# Patient Record
Sex: Female | Born: 1955 | Race: White | Hispanic: No | Smoking: Never smoker
Health system: Southern US, Community
[De-identification: ages and names within clinical notes are randomized; demographics above are authoritative.]

## PROBLEM LIST (undated history)

## (undated) DIAGNOSIS — I701 Atherosclerosis of renal artery: Secondary | ICD-10-CM

## (undated) DIAGNOSIS — J45909 Unspecified asthma, uncomplicated: Secondary | ICD-10-CM

## (undated) DIAGNOSIS — B029 Zoster without complications: Secondary | ICD-10-CM

## (undated) DIAGNOSIS — M810 Age-related osteoporosis without current pathological fracture: Secondary | ICD-10-CM

## (undated) DIAGNOSIS — I2510 Atherosclerotic heart disease of native coronary artery without angina pectoris: Secondary | ICD-10-CM

## (undated) DIAGNOSIS — I219 Acute myocardial infarction, unspecified: Secondary | ICD-10-CM

## (undated) DIAGNOSIS — I639 Cerebral infarction, unspecified: Secondary | ICD-10-CM

## (undated) DIAGNOSIS — N189 Chronic kidney disease, unspecified: Secondary | ICD-10-CM

## (undated) DIAGNOSIS — I10 Essential (primary) hypertension: Secondary | ICD-10-CM

## (undated) DIAGNOSIS — J392 Other diseases of pharynx: Secondary | ICD-10-CM

## (undated) HISTORY — DX: Chronic kidney disease, unspecified: N18.9

## (undated) HISTORY — DX: Zoster without complications: B02.9

## (undated) HISTORY — DX: Atherosclerosis of renal artery: I70.1

## (undated) HISTORY — DX: Other diseases of pharynx: J39.2

## (undated) HISTORY — DX: Atherosclerotic heart disease of native coronary artery without angina pectoris: I25.10

## (undated) HISTORY — DX: Acute myocardial infarction, unspecified: I21.9

## (undated) HISTORY — DX: Age-related osteoporosis without current pathological fracture: M81.0

## (undated) HISTORY — DX: Essential (primary) hypertension: I10

## (undated) HISTORY — DX: Unspecified asthma, uncomplicated: J45.909

## (undated) HISTORY — PX: CORONARY ANGIOPLASTY WITH STENT PLACEMENT: SHX49

## (undated) HISTORY — PX: TUBAL LIGATION: SHX77

## (undated) HISTORY — DX: Cerebral infarction, unspecified: I63.9

---

## 2002-03-27 ENCOUNTER — Inpatient Hospital Stay (HOSPITAL_COMMUNITY): Admission: EM | Admit: 2002-03-27 | Discharge: 2002-03-30 | Payer: Self-pay | Admitting: Cardiology

## 2002-03-27 ENCOUNTER — Encounter: Payer: Self-pay | Admitting: Emergency Medicine

## 2003-04-27 ENCOUNTER — Emergency Department (HOSPITAL_COMMUNITY): Admission: EM | Admit: 2003-04-27 | Discharge: 2003-04-27 | Payer: Self-pay | Admitting: Emergency Medicine

## 2003-04-28 ENCOUNTER — Emergency Department (HOSPITAL_COMMUNITY): Admission: EM | Admit: 2003-04-28 | Discharge: 2003-04-28 | Payer: Self-pay | Admitting: Emergency Medicine

## 2003-05-25 ENCOUNTER — Emergency Department (HOSPITAL_COMMUNITY): Admission: EM | Admit: 2003-05-25 | Discharge: 2003-05-25 | Payer: Self-pay | Admitting: Emergency Medicine

## 2003-07-06 ENCOUNTER — Ambulatory Visit (HOSPITAL_COMMUNITY): Admission: RE | Admit: 2003-07-06 | Discharge: 2003-07-06 | Payer: Self-pay | Admitting: Neurology

## 2003-09-05 ENCOUNTER — Emergency Department (HOSPITAL_COMMUNITY): Admission: EM | Admit: 2003-09-05 | Discharge: 2003-09-05 | Payer: Self-pay | Admitting: Emergency Medicine

## 2003-09-07 ENCOUNTER — Ambulatory Visit (HOSPITAL_COMMUNITY): Admission: RE | Admit: 2003-09-07 | Discharge: 2003-09-07 | Payer: Self-pay | Admitting: Family Medicine

## 2004-08-29 ENCOUNTER — Emergency Department (HOSPITAL_COMMUNITY): Admission: EM | Admit: 2004-08-29 | Discharge: 2004-08-29 | Payer: Self-pay | Admitting: Emergency Medicine

## 2004-08-31 ENCOUNTER — Inpatient Hospital Stay (HOSPITAL_COMMUNITY): Admission: EM | Admit: 2004-08-31 | Discharge: 2004-09-02 | Payer: Self-pay | Admitting: Emergency Medicine

## 2005-04-01 ENCOUNTER — Inpatient Hospital Stay (HOSPITAL_COMMUNITY): Admission: AD | Admit: 2005-04-01 | Discharge: 2005-04-06 | Payer: Self-pay | Admitting: *Deleted

## 2005-04-01 ENCOUNTER — Ambulatory Visit: Payer: Self-pay | Admitting: Physical Medicine & Rehabilitation

## 2005-04-01 ENCOUNTER — Encounter: Payer: Self-pay | Admitting: Emergency Medicine

## 2005-04-02 ENCOUNTER — Encounter (INDEPENDENT_AMBULATORY_CARE_PROVIDER_SITE_OTHER): Payer: Self-pay | Admitting: *Deleted

## 2005-04-14 ENCOUNTER — Ambulatory Visit: Payer: Self-pay | Admitting: Family Medicine

## 2005-05-06 ENCOUNTER — Ambulatory Visit (HOSPITAL_COMMUNITY): Admission: RE | Admit: 2005-05-06 | Discharge: 2005-05-06 | Payer: Self-pay | Admitting: Family Medicine

## 2005-05-06 ENCOUNTER — Encounter (INDEPENDENT_AMBULATORY_CARE_PROVIDER_SITE_OTHER): Payer: Self-pay | Admitting: Internal Medicine

## 2005-05-25 ENCOUNTER — Ambulatory Visit: Payer: Self-pay | Admitting: Family Medicine

## 2006-01-12 ENCOUNTER — Ambulatory Visit: Payer: Self-pay | Admitting: Internal Medicine

## 2006-01-26 ENCOUNTER — Ambulatory Visit (HOSPITAL_COMMUNITY): Admission: RE | Admit: 2006-01-26 | Discharge: 2006-01-26 | Payer: Self-pay | Admitting: Internal Medicine

## 2006-01-29 ENCOUNTER — Ambulatory Visit: Payer: Self-pay | Admitting: Internal Medicine

## 2006-02-03 ENCOUNTER — Ambulatory Visit (HOSPITAL_COMMUNITY): Admission: RE | Admit: 2006-02-03 | Discharge: 2006-02-03 | Payer: Self-pay | Admitting: Internal Medicine

## 2006-02-10 ENCOUNTER — Ambulatory Visit: Payer: Self-pay | Admitting: Internal Medicine

## 2006-02-11 ENCOUNTER — Ambulatory Visit (HOSPITAL_COMMUNITY): Admission: RE | Admit: 2006-02-11 | Discharge: 2006-02-11 | Payer: Self-pay | Admitting: Internal Medicine

## 2006-02-16 ENCOUNTER — Encounter (INDEPENDENT_AMBULATORY_CARE_PROVIDER_SITE_OTHER): Payer: Self-pay | Admitting: Internal Medicine

## 2006-02-27 ENCOUNTER — Encounter (INDEPENDENT_AMBULATORY_CARE_PROVIDER_SITE_OTHER): Payer: Self-pay | Admitting: Internal Medicine

## 2006-03-30 ENCOUNTER — Other Ambulatory Visit: Admission: RE | Admit: 2006-03-30 | Discharge: 2006-03-30 | Payer: Self-pay | Admitting: Internal Medicine

## 2006-03-30 ENCOUNTER — Ambulatory Visit: Payer: Self-pay | Admitting: Internal Medicine

## 2006-03-30 LAB — CONVERTED CEMR LAB
Cholesterol: 238 mg/dL
LDL Cholesterol: 146 mg/dL
Potassium: 4.1 meq/L
Sodium: 139 meq/L
Triglycerides: 228 mg/dL
VLDL: 46 mg/dL

## 2006-04-06 ENCOUNTER — Ambulatory Visit (HOSPITAL_COMMUNITY): Admission: RE | Admit: 2006-04-06 | Discharge: 2006-04-06 | Payer: Self-pay | Admitting: Internal Medicine

## 2006-04-12 DIAGNOSIS — R32 Unspecified urinary incontinence: Secondary | ICD-10-CM | POA: Insufficient documentation

## 2006-04-12 DIAGNOSIS — N183 Chronic kidney disease, stage 3 (moderate): Secondary | ICD-10-CM

## 2006-04-12 DIAGNOSIS — I6930 Unspecified sequelae of cerebral infarction: Secondary | ICD-10-CM | POA: Insufficient documentation

## 2006-04-13 ENCOUNTER — Encounter (INDEPENDENT_AMBULATORY_CARE_PROVIDER_SITE_OTHER): Payer: Self-pay | Admitting: Internal Medicine

## 2006-05-05 ENCOUNTER — Ambulatory Visit (HOSPITAL_COMMUNITY): Admission: RE | Admit: 2006-05-05 | Discharge: 2006-05-05 | Payer: Self-pay | Admitting: Internal Medicine

## 2006-05-06 ENCOUNTER — Encounter (INDEPENDENT_AMBULATORY_CARE_PROVIDER_SITE_OTHER): Payer: Self-pay | Admitting: Internal Medicine

## 2006-06-02 ENCOUNTER — Ambulatory Visit: Payer: Self-pay | Admitting: Internal Medicine

## 2006-06-30 ENCOUNTER — Ambulatory Visit: Payer: Self-pay | Admitting: Internal Medicine

## 2006-06-30 DIAGNOSIS — E785 Hyperlipidemia, unspecified: Secondary | ICD-10-CM

## 2006-06-30 DIAGNOSIS — K219 Gastro-esophageal reflux disease without esophagitis: Secondary | ICD-10-CM

## 2006-08-10 ENCOUNTER — Encounter (INDEPENDENT_AMBULATORY_CARE_PROVIDER_SITE_OTHER): Payer: Self-pay | Admitting: Internal Medicine

## 2006-08-11 ENCOUNTER — Ambulatory Visit: Payer: Self-pay | Admitting: Internal Medicine

## 2006-08-12 LAB — CONVERTED CEMR LAB
AST: 16 units/L (ref 0–37)
Albumin: 4.4 g/dL (ref 3.5–5.2)
Alkaline Phosphatase: 185 units/L — ABNORMAL HIGH (ref 39–117)
Total Bilirubin: 0.4 mg/dL (ref 0.3–1.2)
Total Protein: 7.9 g/dL (ref 6.0–8.3)

## 2006-09-10 ENCOUNTER — Ambulatory Visit: Payer: Self-pay | Admitting: Internal Medicine

## 2006-09-13 LAB — CONVERTED CEMR LAB
ALT: 17 units/L (ref 0–35)
CO2: 19 meq/L (ref 19–32)
Cholesterol: 169 mg/dL (ref 0–200)
Creatinine, Ser: 1.13 mg/dL (ref 0.40–1.20)
HDL: 43 mg/dL (ref >39)
Total Bilirubin: 0.4 mg/dL (ref 0.3–1.2)
Total CHOL/HDL Ratio: 3.9
VLDL: 32 mg/dL (ref 0–40)

## 2006-11-11 IMAGING — US US EXTREM LOW ARTERIAL SEG MULTIPLE*R*
1 series · 14 of 19 positions shown · non-contrast
Comparison: none

HISTORY: Right groin mass status post angiography, question pseudoaneurysm

[Series 1: unknown · 0.09mm/px · 14 of 19 slices shown]
[im 1/19]
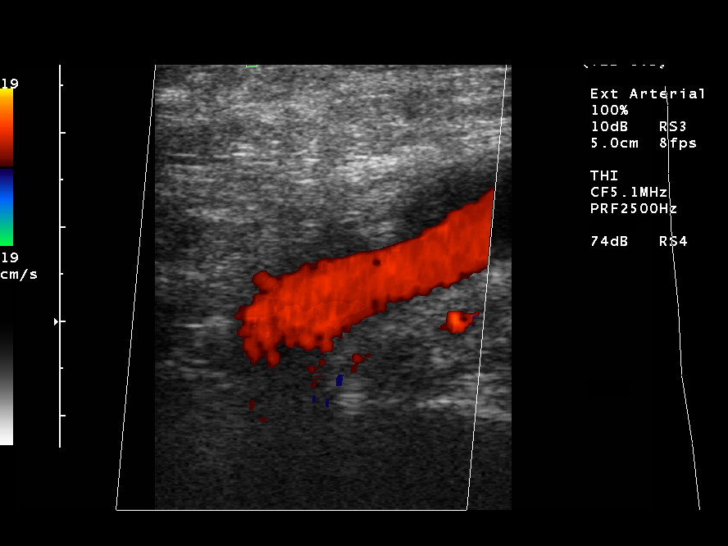
[im 3/19]
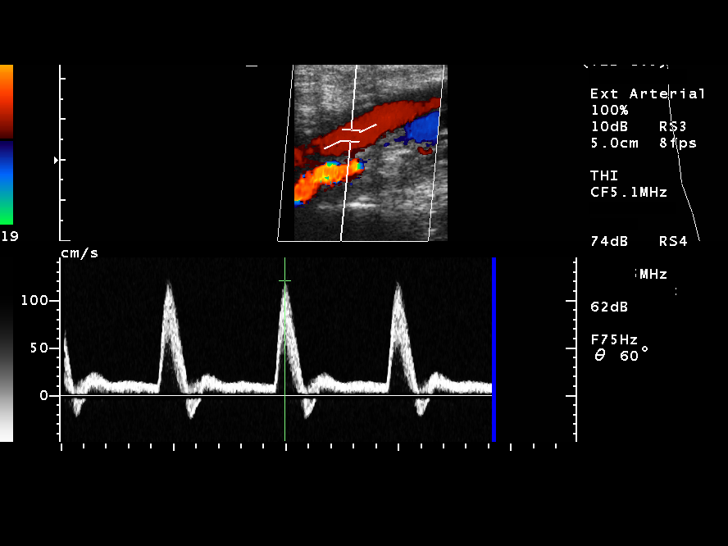
[im 4/19]
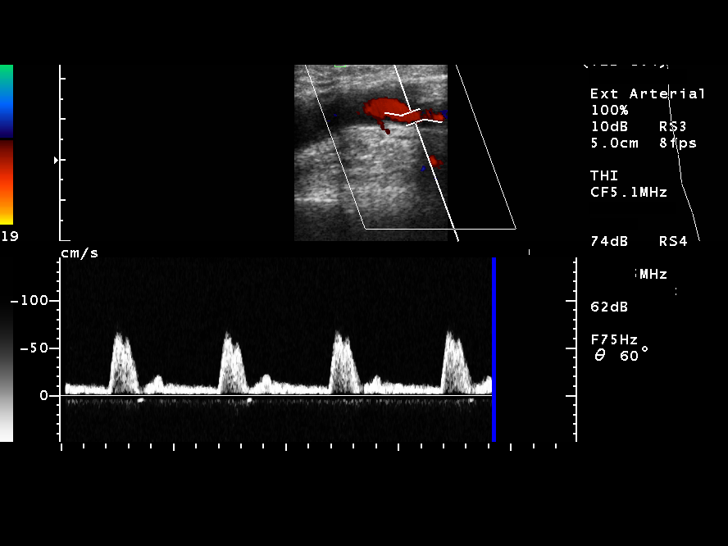
[im 5/19]
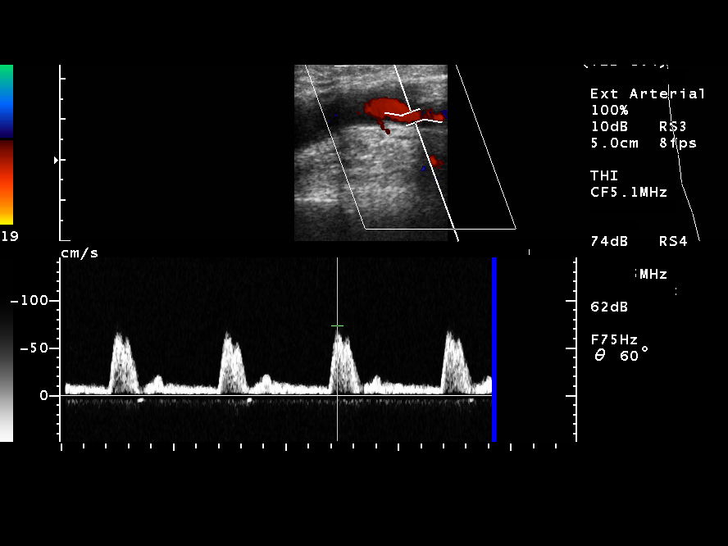
[im 7/19]
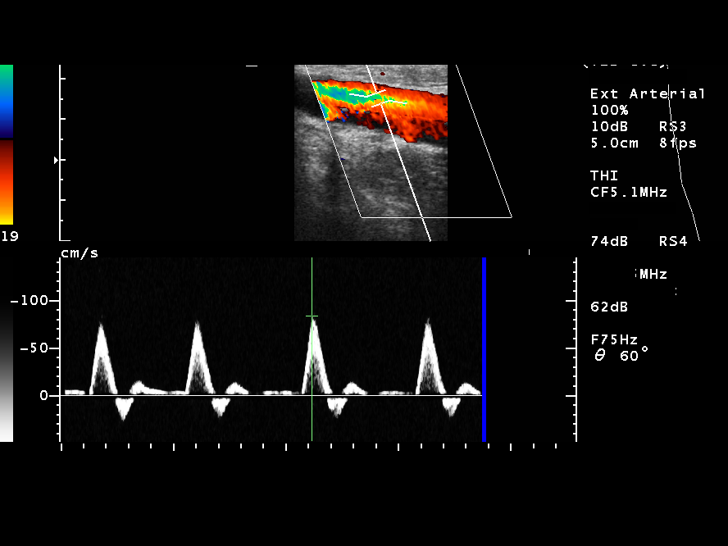
[im 8/19]
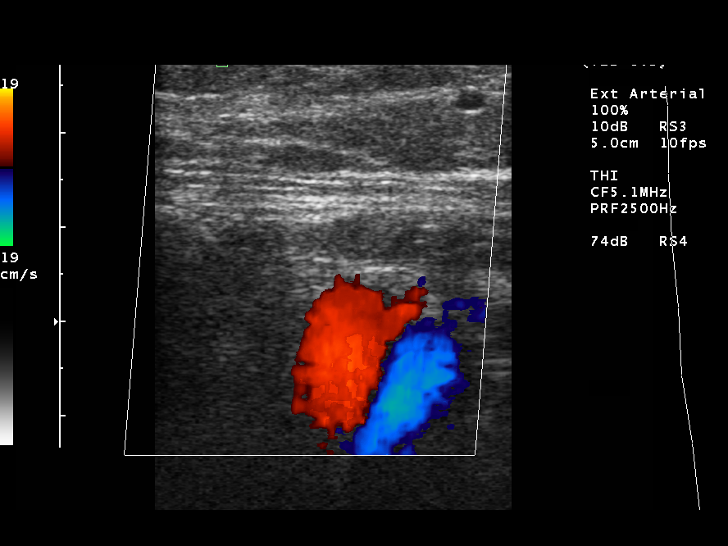
[im 9/19]
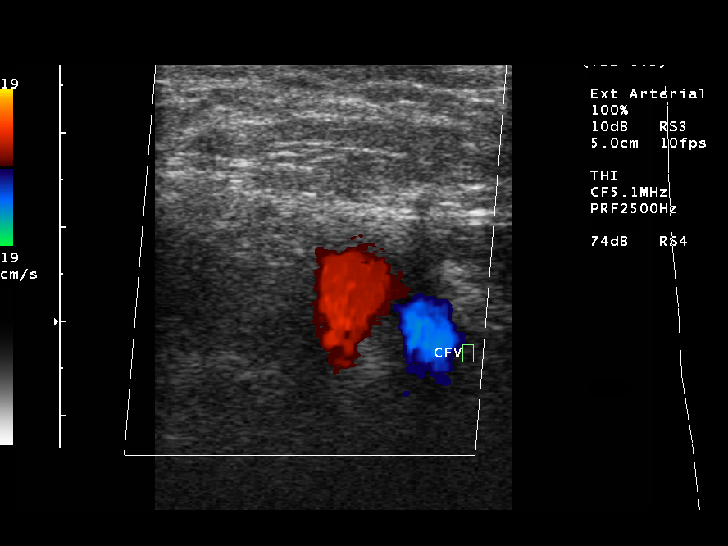
[im 11/19]
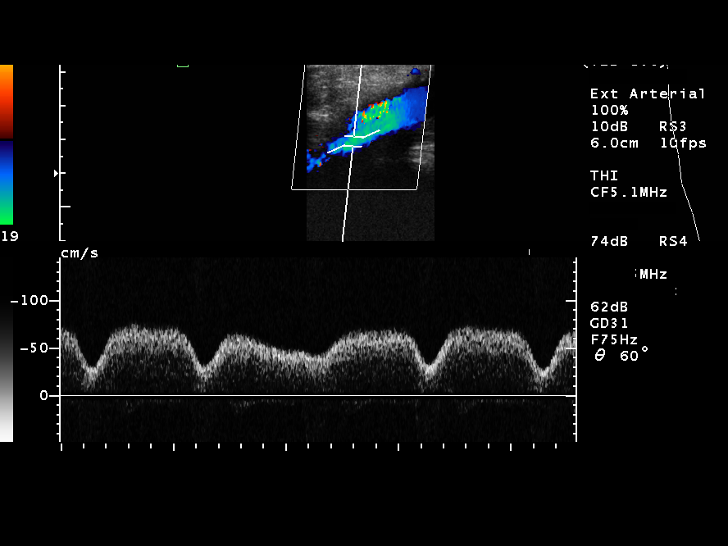
[im 12/19]
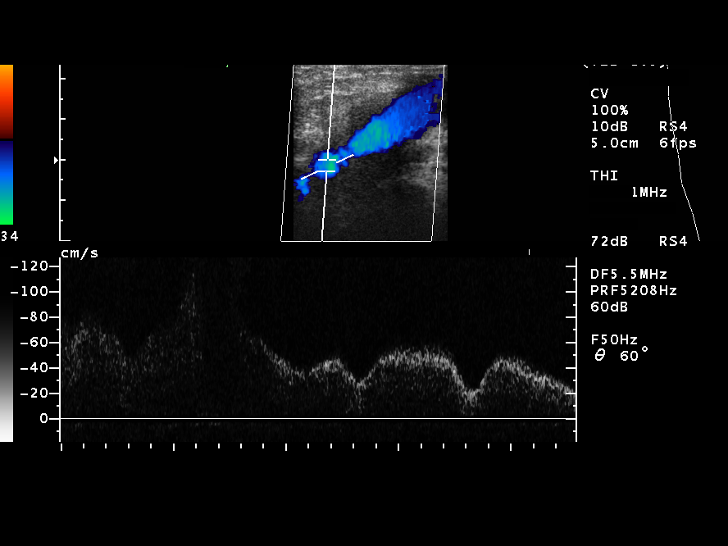
[im 13/19]
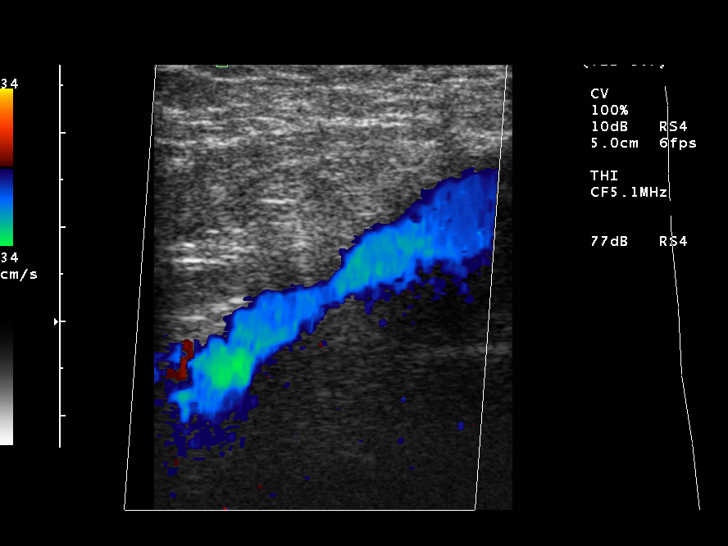
[im 15/19]
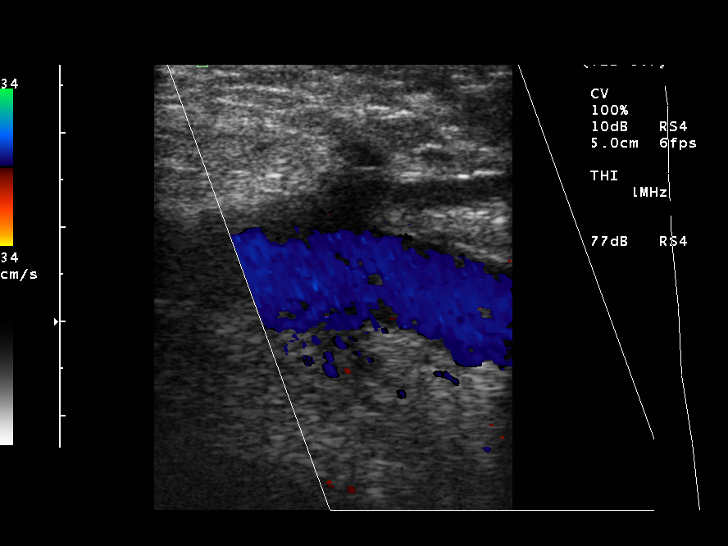
[im 16/19]
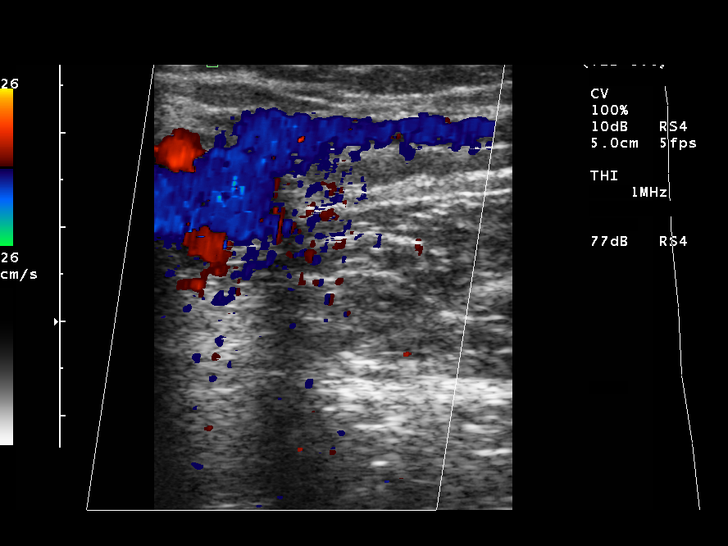
[im 17/19]
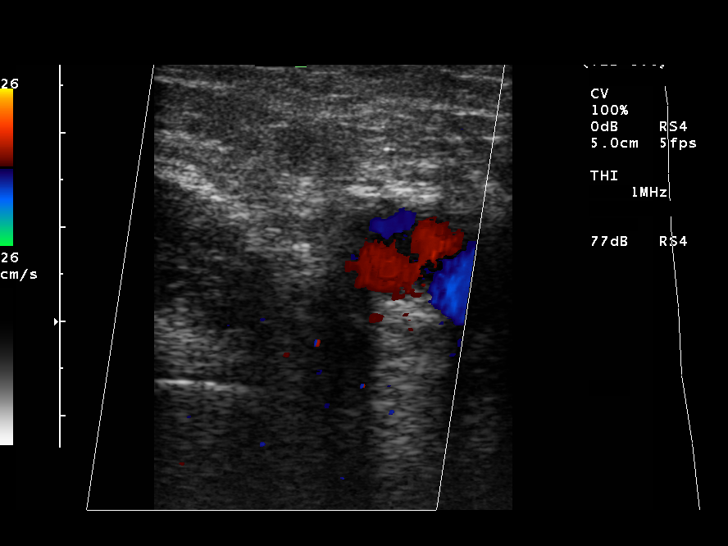
[im 19/19]
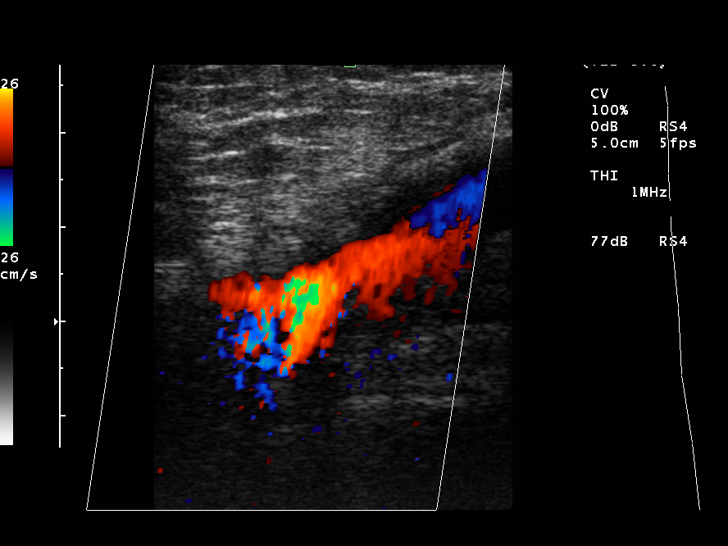

[14 of 19 positions shown; findings below may reference images not displayed]

ULTRASOUND ARTERIAL RIGHT LOWER EXTREMITY:

Vascular sonography proximal right lower extremity arterial and deep venous
systems performed.

No deep venous thrombosis identified within right common femoral vein,
superficial femoral vein, or greater saphenous vein.
Waveform analysis reveals normal venous flow pattern without arterialization to
suggest fistula.
Normal appearance of the common femoral, profunda femoral ,and superficial
femoral arteries proximally.
Normal arterial wave forms identified.
No evidence of soft tissue mass, abnormal fluid collection, or abnormal color
Doppler flow.
Specifically, no evidence of significant hematoma or pseudoaneurysm/aneurysm.
IMPRESSION: Negative exam of site of palpable concern at right inguinal region.

## 2006-12-15 ENCOUNTER — Ambulatory Visit: Payer: Self-pay | Admitting: Internal Medicine

## 2007-02-02 IMAGING — US US BREAST*L*
1 series · 3 of 3 positions shown · non-contrast
Comparison: none

LEFT DIAGNOSTIC MAMMOGRAM

LEFT BREAST ULTRASOUND
UNILATERAL LEFT DIAGNOSTIC MAMMOGRAM AND LEFT BREAST ULTRASOUND:
CLINICAL DATA: Abnormal screening study.

[Series 1: unknown · 0.05mm/px · 3 of 3 slices shown]
[im 1/3]
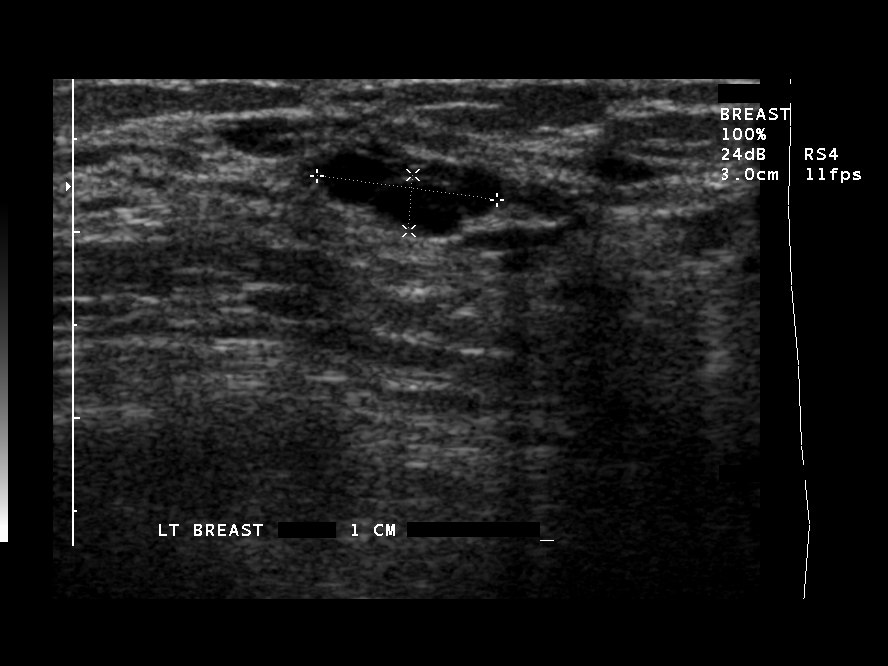
[im 2/3]
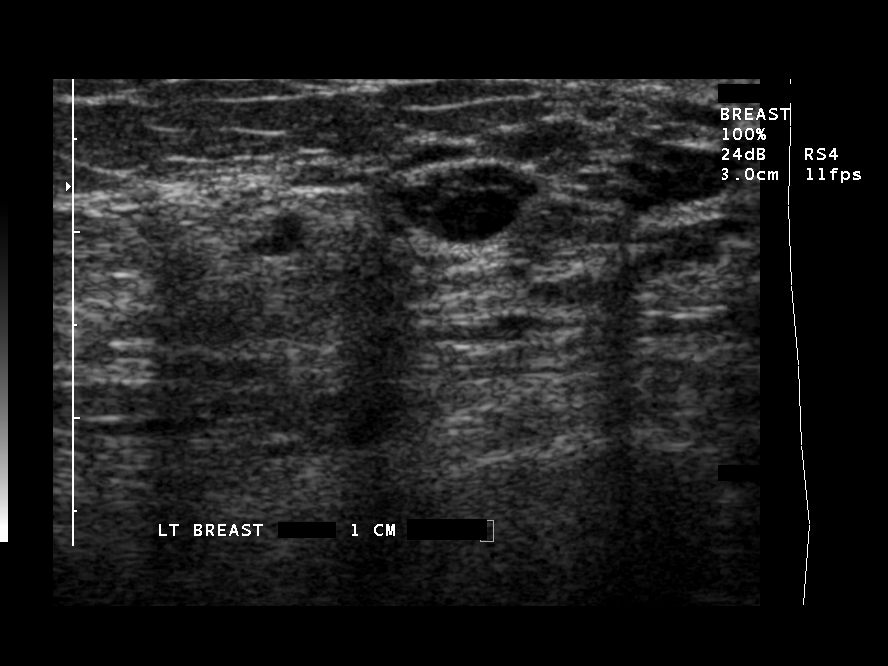
[im 3/3]
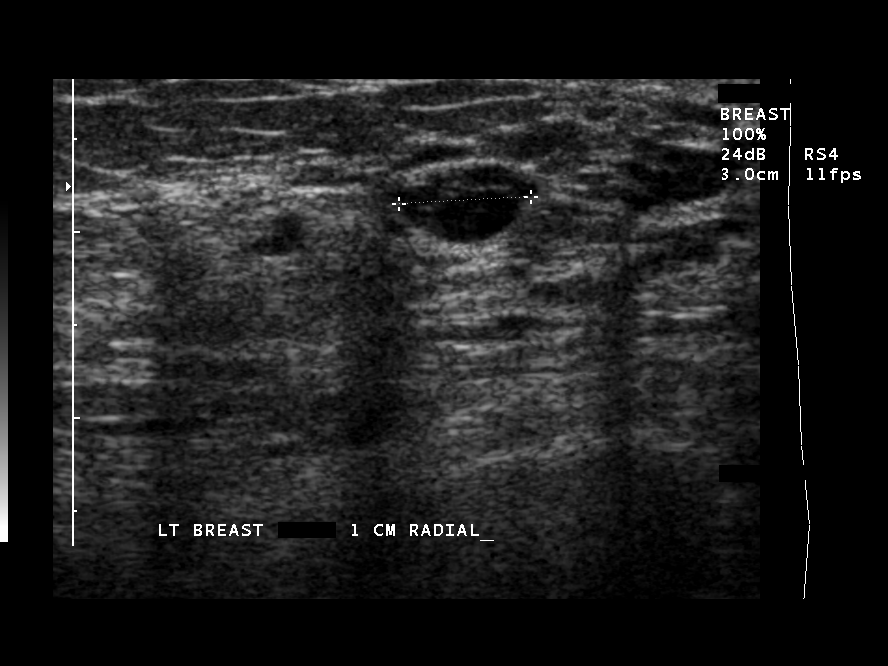

[3 of 3 positions shown; findings below may reference images not displayed]

Spot compression true lateral views of the left breast were performed. There is persistence of an 
asymmetric density superiorly. There are no associated malignant-type microcalcifications.

On physical exam, I do not palpate a mass on the left breast.  Sonographic evaluation of the entire
upper portion of the breast was performed. Sonographically, a simple cyst is imaged at 12 o'clock 
approximately 1 cm. from the nipple measuring 10 x 3 x 7 mm.
IMPRESSION: Left breast cyst.  No evidence of malignancy.  Screening mammogram in one year is recommended.

ASSESSMENT: Benign - BI-RADS 2

Screening mammogram of both breasts in 1 year.
,

## 2007-02-16 ENCOUNTER — Ambulatory Visit: Payer: Self-pay | Admitting: Internal Medicine

## 2007-02-17 LAB — CONVERTED CEMR LAB
ALT: 14 units/L (ref 0–35)
AST: 20 units/L (ref 0–37)
Albumin: 4.4 g/dL (ref 3.5–5.2)
Alkaline Phosphatase: 180 units/L — ABNORMAL HIGH (ref 39–117)
Basophils Absolute: 0 10*3/uL (ref 0.0–0.1)
Basophils Relative: 0 % (ref 0–1)
Calcium: 9.3 mg/dL (ref 8.4–10.5)
Chloride: 107 meq/L (ref 96–112)
LDL Cholesterol: 78 mg/dL (ref 0–99)
MCHC: 33 g/dL (ref 30.0–36.0)
Monocytes Relative: 7 % (ref 3–11)
Neutro Abs: 6.5 10*3/uL (ref 1.7–7.7)
Neutrophils Relative %: 76 % (ref 43–77)
Platelets: 241 10*3/uL (ref 150–400)
Potassium: 4.1 meq/L (ref 3.5–5.3)
RBC: 4.6 M/uL (ref 3.87–5.11)
RDW: 14.3 % — ABNORMAL HIGH (ref 11.5–14.0)
Sodium: 141 meq/L (ref 135–145)
Total Protein: 7.6 g/dL (ref 6.0–8.3)

## 2007-05-02 ENCOUNTER — Ambulatory Visit: Payer: Self-pay | Admitting: Family Medicine

## 2007-05-19 ENCOUNTER — Encounter: Payer: Self-pay | Admitting: Family Medicine

## 2007-08-11 ENCOUNTER — Ambulatory Visit: Payer: Self-pay | Admitting: Internal Medicine

## 2007-08-11 ENCOUNTER — Telehealth (INDEPENDENT_AMBULATORY_CARE_PROVIDER_SITE_OTHER): Payer: Self-pay | Admitting: *Deleted

## 2007-08-16 ENCOUNTER — Encounter (INDEPENDENT_AMBULATORY_CARE_PROVIDER_SITE_OTHER): Payer: Self-pay | Admitting: Internal Medicine

## 2007-08-17 ENCOUNTER — Telehealth (INDEPENDENT_AMBULATORY_CARE_PROVIDER_SITE_OTHER): Payer: Self-pay | Admitting: Internal Medicine

## 2007-08-17 LAB — CONVERTED CEMR LAB
ALT: 14 units/L (ref 0–35)
Albumin: 4.7 g/dL (ref 3.5–5.2)
CO2: 22 meq/L (ref 19–32)
Cholesterol: 174 mg/dL (ref 0–200)
Glucose, Bld: 98 mg/dL (ref 70–99)
LDL Cholesterol: 87 mg/dL (ref 0–99)
Potassium: 4.4 meq/L (ref 3.5–5.3)
Sodium: 142 meq/L (ref 135–145)
Total Protein: 8 g/dL (ref 6.0–8.3)
Triglycerides: 131 mg/dL (ref <150)
VLDL: 26 mg/dL (ref 0–40)

## 2007-08-18 ENCOUNTER — Telehealth (INDEPENDENT_AMBULATORY_CARE_PROVIDER_SITE_OTHER): Payer: Self-pay | Admitting: *Deleted

## 2007-08-18 ENCOUNTER — Ambulatory Visit: Payer: Self-pay | Admitting: Cardiovascular Disease

## 2007-09-01 ENCOUNTER — Ambulatory Visit: Payer: Self-pay | Admitting: Cardiovascular Disease

## 2007-09-01 ENCOUNTER — Encounter (INDEPENDENT_AMBULATORY_CARE_PROVIDER_SITE_OTHER): Payer: Self-pay | Admitting: Internal Medicine

## 2007-09-01 ENCOUNTER — Encounter (HOSPITAL_COMMUNITY): Admission: RE | Admit: 2007-09-01 | Discharge: 2007-10-01 | Payer: Self-pay | Admitting: Cardiovascular Disease

## 2007-09-07 ENCOUNTER — Ambulatory Visit (HOSPITAL_COMMUNITY): Admission: RE | Admit: 2007-09-07 | Discharge: 2007-09-07 | Payer: Self-pay | Admitting: Cardiovascular Disease

## 2007-09-07 ENCOUNTER — Ambulatory Visit: Payer: Self-pay | Admitting: Cardiovascular Disease

## 2007-09-09 ENCOUNTER — Ambulatory Visit: Payer: Self-pay | Admitting: Cardiovascular Disease

## 2007-09-09 ENCOUNTER — Inpatient Hospital Stay (HOSPITAL_BASED_OUTPATIENT_CLINIC_OR_DEPARTMENT_OTHER): Admission: RE | Admit: 2007-09-09 | Discharge: 2007-09-09 | Payer: Self-pay | Admitting: Cardiovascular Disease

## 2007-09-27 ENCOUNTER — Ambulatory Visit: Payer: Self-pay

## 2007-11-21 ENCOUNTER — Ambulatory Visit: Payer: Self-pay | Admitting: Cardiovascular Disease

## 2008-02-20 ENCOUNTER — Ambulatory Visit: Payer: Self-pay | Admitting: Internal Medicine

## 2008-02-20 ENCOUNTER — Ambulatory Visit (HOSPITAL_COMMUNITY): Admission: RE | Admit: 2008-02-20 | Discharge: 2008-02-20 | Payer: Self-pay | Admitting: Internal Medicine

## 2008-05-30 ENCOUNTER — Ambulatory Visit: Payer: Self-pay | Admitting: Internal Medicine

## 2008-05-30 ENCOUNTER — Encounter: Payer: Self-pay | Admitting: Cardiology

## 2008-05-30 LAB — CONVERTED CEMR LAB
ALT: 11 units/L
AST: 14 units/L
Albumin: 4.4 g/dL
Alkaline Phosphatase: 148 units/L
Bilirubin, Direct: 0.3 mg/dL
Cholesterol: 163 mg/dL
HDL: 47 mg/dL
LDL Cholesterol: 84 mg/dL
Potassium: 4.1 meq/L
Sodium: 143 meq/L
Total Protein: 7.5 g/dL
Triglycerides: 159 mg/dL

## 2008-05-31 LAB — CONVERTED CEMR LAB
ALT: 11 units/L (ref 0–35)
AST: 14 units/L (ref 0–37)
Albumin: 4.4 g/dL (ref 3.5–5.2)
CO2: 19 meq/L (ref 19–32)
Calcium: 8.9 mg/dL (ref 8.4–10.5)
Chloride: 109 meq/L (ref 96–112)
Cholesterol: 163 mg/dL (ref 0–200)
Creatinine, Ser: 0.94 mg/dL (ref 0.40–1.20)
Potassium: 4.1 meq/L (ref 3.5–5.3)
Total CHOL/HDL Ratio: 3.5

## 2008-06-13 ENCOUNTER — Ambulatory Visit: Payer: Self-pay | Admitting: Internal Medicine

## 2008-06-13 DIAGNOSIS — J45909 Unspecified asthma, uncomplicated: Secondary | ICD-10-CM | POA: Insufficient documentation

## 2008-06-21 ENCOUNTER — Ambulatory Visit: Payer: Self-pay | Admitting: Cardiology

## 2008-06-25 ENCOUNTER — Encounter: Payer: Self-pay | Admitting: Cardiology

## 2008-06-25 ENCOUNTER — Ambulatory Visit (HOSPITAL_COMMUNITY): Admission: RE | Admit: 2008-06-25 | Discharge: 2008-06-25 | Payer: Self-pay | Admitting: Cardiology

## 2008-08-17 ENCOUNTER — Encounter (INDEPENDENT_AMBULATORY_CARE_PROVIDER_SITE_OTHER): Payer: Self-pay | Admitting: Internal Medicine

## 2008-09-17 ENCOUNTER — Ambulatory Visit: Payer: Self-pay | Admitting: Cardiology

## 2009-02-26 ENCOUNTER — Ambulatory Visit: Payer: Self-pay | Admitting: Cardiology

## 2009-02-26 DIAGNOSIS — I701 Atherosclerosis of renal artery: Secondary | ICD-10-CM | POA: Insufficient documentation

## 2009-02-26 DIAGNOSIS — I2510 Atherosclerotic heart disease of native coronary artery without angina pectoris: Secondary | ICD-10-CM

## 2009-02-26 DIAGNOSIS — I10 Essential (primary) hypertension: Secondary | ICD-10-CM | POA: Insufficient documentation

## 2009-03-05 ENCOUNTER — Encounter (INDEPENDENT_AMBULATORY_CARE_PROVIDER_SITE_OTHER): Payer: Self-pay | Admitting: *Deleted

## 2009-03-05 ENCOUNTER — Encounter: Payer: Self-pay | Admitting: Cardiology

## 2009-03-05 LAB — CONVERTED CEMR LAB
ALT: 12 units/L
AST: 20 units/L
Alkaline Phosphatase: 140 units/L
Calcium: 9.4 mg/dL
Chloride: 107 meq/L
Creatinine, Ser: 1.03 mg/dL
HDL: 41 mg/dL
LDL Cholesterol: 103 mg/dL
Potassium: 4.5 meq/L
Triglycerides: 245 mg/dL

## 2009-03-06 ENCOUNTER — Encounter (INDEPENDENT_AMBULATORY_CARE_PROVIDER_SITE_OTHER): Payer: Self-pay | Admitting: *Deleted

## 2009-03-06 LAB — CONVERTED CEMR LAB
Albumin: 4.2 g/dL (ref 3.5–5.2)
Alkaline Phosphatase: 140 units/L — ABNORMAL HIGH (ref 39–117)
BUN: 17 mg/dL (ref 6–23)
CO2: 22 meq/L (ref 19–32)
Chloride: 107 meq/L (ref 96–112)
Creatinine, Ser: 1.03 mg/dL (ref 0.40–1.20)
Glucose, Bld: 91 mg/dL (ref 70–99)
LDL Cholesterol: 103 mg/dL — ABNORMAL HIGH (ref 0–99)
Total Bilirubin: 0.3 mg/dL (ref 0.3–1.2)

## 2009-08-28 ENCOUNTER — Encounter (INDEPENDENT_AMBULATORY_CARE_PROVIDER_SITE_OTHER): Payer: Self-pay | Admitting: *Deleted

## 2009-08-30 ENCOUNTER — Encounter (INDEPENDENT_AMBULATORY_CARE_PROVIDER_SITE_OTHER): Payer: Self-pay | Admitting: *Deleted

## 2009-08-30 LAB — CONVERTED CEMR LAB
Albumin: 4.2 g/dL
Alkaline Phosphatase: 177 units/L
BUN: 14 mg/dL
CO2: 21 meq/L
Calcium: 9.6 mg/dL
Creatinine, Ser: 1.13 mg/dL
Potassium: 4.9 meq/L

## 2009-09-03 ENCOUNTER — Ambulatory Visit: Payer: Self-pay | Admitting: Cardiology

## 2009-09-05 ENCOUNTER — Encounter (INDEPENDENT_AMBULATORY_CARE_PROVIDER_SITE_OTHER): Payer: Self-pay | Admitting: *Deleted

## 2009-11-07 ENCOUNTER — Emergency Department (HOSPITAL_COMMUNITY): Admission: EM | Admit: 2009-11-07 | Discharge: 2009-11-07 | Payer: Self-pay | Admitting: Emergency Medicine

## 2010-02-28 ENCOUNTER — Ambulatory Visit: Payer: Self-pay | Admitting: Cardiology

## 2010-03-04 LAB — CONVERTED CEMR LAB
ALT: 16 units/L (ref 0–35)
AST: 17 units/L (ref 0–37)
Albumin: 4.2 g/dL (ref 3.5–5.2)
Alkaline Phosphatase: 175 units/L — ABNORMAL HIGH (ref 39–117)
Cholesterol: 190 mg/dL (ref 0–200)
HDL: 49 mg/dL (ref >39)
LDL Cholesterol: 103 mg/dL — ABNORMAL HIGH (ref 0–99)
Total Protein: 6.9 g/dL (ref 6.0–8.3)
Triglycerides: 189 mg/dL — ABNORMAL HIGH (ref <150)

## 2010-03-05 ENCOUNTER — Encounter: Payer: Self-pay | Admitting: Cardiology

## 2010-03-05 ENCOUNTER — Encounter (INDEPENDENT_AMBULATORY_CARE_PROVIDER_SITE_OTHER): Payer: Self-pay

## 2010-03-20 ENCOUNTER — Encounter: Payer: Self-pay | Admitting: Cardiology

## 2010-03-21 ENCOUNTER — Ambulatory Visit: Payer: Self-pay

## 2010-03-21 ENCOUNTER — Encounter: Payer: Self-pay | Admitting: Cardiology

## 2010-03-25 ENCOUNTER — Encounter: Payer: Self-pay | Admitting: Cardiology

## 2010-05-14 ENCOUNTER — Telehealth (INDEPENDENT_AMBULATORY_CARE_PROVIDER_SITE_OTHER): Payer: Self-pay

## 2010-06-03 ENCOUNTER — Encounter: Payer: Self-pay | Admitting: Cardiology

## 2010-06-03 LAB — CONVERTED CEMR LAB
ALT: 20 units/L (ref 0–35)
AST: 23 units/L (ref 0–37)
Albumin: 4.5 g/dL (ref 3.5–5.2)
Alkaline Phosphatase: 218 units/L — ABNORMAL HIGH (ref 39–117)
HDL: 46 mg/dL (ref >39)
Total CHOL/HDL Ratio: 3.8
Total Protein: 7.5 g/dL (ref 6.0–8.3)
Triglycerides: 191 mg/dL — ABNORMAL HIGH (ref <150)

## 2010-06-08 ENCOUNTER — Encounter: Payer: Self-pay | Admitting: Family Medicine

## 2010-06-17 NOTE — Letter (Signed)
Summary: [place] [facility] Work Engineer, agricultural at [facility]  [address] [place] [address]   Phone: [phone]  Fax: [phone]     March 05, 2010 MRN: [id number]   [name] [address] [place], [place]  [address]      YOUR LAB WORK IS DUE  June 02, 2010 _________________________________________  Please go to [facility], located across the street from [facility] on the second floor.  Hours are Monday - Friday 7am until 7:30pm         Saturday 8am until 12noon    _X_  DO NOT EAT OR DRINK AFTER MIDNIGHT EVENING PRIOR TO LABWORK  __ YOUR LABWORK IS NOT FASTING --YOU MAY EAT PRIOR TO LABWORK

## 2010-06-17 NOTE — Letter (Signed)
Summary: [facility] Office Visits  [facility] Office Visits   Imported By: Lutricia Horsfall LPN 21/30/8657 84:69:62  _____________________________________________________________________  External Attachment:    Type:   Image     Comment:   External Document

## 2010-06-17 NOTE — Miscellaneous (Signed)
Summary: [facility] 08/30/2009  Clinical Lists Changes  Observations: Added new observation of CALCIUM: 9.6 mg/dL (52/84/1324 40:10) Added new observation of ALBUMIN: 4.2 g/dL (27/25/3664 40:34) Added new observation of PROTEIN, TOT: 7.5 g/dL (74/25/9563 87:56) Added new observation of SGPT (ALT): 16 units/L (08/30/2009 15:34) Added new observation of SGOT (AST): 19 units/L (08/30/2009 15:34) Added new observation of ALK PHOS: 177 units/L (08/30/2009 15:34) Added new observation of GFR AA: 64 mL/min/1.38m2 (08/30/2009 15:34) Added new observation of GFR: 55 mL/min (08/30/2009 15:34) Added new observation of CREATININE: 1.13 mg/dL (43/32/9518 84:16) Added new observation of BUN: 14 mg/dL (60/63/0160 10:93) Added new observation of CO2 PLSM/SER: 21 meq/L (08/30/2009 15:34) Added new observation of CL SERUM: 104 meq/L (08/30/2009 15:34) Added new observation of K SERUM: 4.9 meq/L (08/30/2009 15:34) Added new observation of NA: 141 meq/L (08/30/2009 15:34) Added new observation of TSH: 2.960 microintl units/mL (08/30/2009 15:34) Added new observation of T3 FREE: 29 pg/mL (08/30/2009 15:34)

## 2010-06-17 NOTE — Assessment & Plan Note (Signed)
Summary: K7Q    Visit Type:  Follow-up Primary Provider:  Dr. Kyra Manges   History of Present Illness: 55 year old woman presents for followup. She was seen back in April. She states she has been feeling fairly well, no active angina, continues to ambulate with a walker, and still does her activities of daily living. She states that she mopped and swept the floors before coming here without major difficulty.  Followup labs from primary care in April showed AST 19, ALT 16, TSH 2.9, potassium 4.9, BUN 14, creatinine 1.1. No lipid profile noted.  I reviewed her medications. We discussed changing her simvastatin to Lipitor in light of FDA recommendations regarding dosing and the fact that she is on concurrent amlodipine. Her blood pressure looks much better today.  We did discuss a followup renal artery duplex in light of her previous history of RAS and stent placement.  Clinical Review Panels:  Echocardiogram Echocardiogram  SUMMARY   -  Overall left ventricular systolic function was normal. Left         ventricular ejection fraction was estimated , range being 55         % to 60 %. There was akinesis of the apical inferoseptal         wall. Left ventricular wall thickness was mildly increased.         Features were consistent with mild diastolic dysfunction.   -  Left atrial size was at the upper limits of normal.   -  The estimated peak pulmonary artery systolic pressure was mildly         increased. (06/25/2008)  Cardiac Imaging Cardiac Cath Findings  The left main coronary artery was normal.      There was a high takeoff diagonal or intermediate branch, which had a   30% ostial stenosis.      Stent at the proximal LAD was 100% occluded.      The LAD itself had recently good collaterals.  There were left-to-left   and also right-to-left.  Circumflex coronary artery was nondominant.  It   was a large caliber vessel.  There were 2 large obtuse marginal   branches, which  were normal.      The right coronary artery was dominant.  It was normal.  There an RV   groove branch with an 80% ostial lesion.      The PDA provided nice septal collaterals to the LAD.      RAO ventriculography, RAO ventriculography showed anterior apical   hypokinesis with an EF of 45%.  There is no mural apical clot.  There is   no MR.  LV pressure was 119/15, and aortic pressure was 120/80. (09/09/2007)    Current Medications (verified): 1)  Amlodipine Besylate 10 Mg Tabs (Amlodipine Besylate) .Marland Kitchen.. 1 By Mouth Once Daily 2)  Aspirin 81 Mg Tbec (Aspirin) .Marland Kitchen.. 1 By Mouth Once Daily 3)  Simvastatin 80 Mg Tabs (Simvastatin) .Marland Kitchen.. 1 By Mouth Once Daily 4)  Omeprazole 20 Mg Cpdr (Omeprazole) .Marland Kitchen.. 1 By Mouth Once Daily 5)  Wheeled Walker With Seat  Allergies: No Known Drug Allergies  Comments:  Nurse/Medical Assistant: patient reviewed med list from previous ov and stated all meds are correct also no meds no list  Past History:  Past Medical History: Last updated: 02/26/2009 Hypertension Myocardial infarction Cerebrovascular accident, hx of-left leg weakness Urinary incontinence OAB CKD-stage 3 Left renal artery stenosis, stent on right Nasopharyngeal mass-benign-prob. Thornwalds cyst Coronary artery disease, occluded LAD (stent SEHV)  with collaterals Reactive airways disease  Social History: Last updated: 02/26/2009 Single Never Smoked Drug use-yes-hx cocaine abuse Alcohol use-no quit denies heavy use   Past Surgical History: Tubal ligation-1980's  Review of Systems       The patient complains of dyspnea on exertion.  The patient denies anorexia, fever, chest pain, syncope, peripheral edema, prolonged cough, melena, and hematochezia.         Otherwise reviewed and negative.  Vital Signs:  Patient profile:   55 year old female Weight:      161 pounds BMI:     30.53 Pulse rate:   91 / minute BP sitting:   127 / 88  (right arm)  Vitals Entered By: Dreama Saa, CNA (February 28, 2010 2:41 PM)  Physical Exam  Additional Exam:  Chronically ill-appearing woman, in no acute distress. HEENT: Conjunctiva and lids normal, oropharynx with poor dentition. Neck: Supple, no loud carotid bruits or elevated jugular venous pressure. No thyromegaly. Lungs: Clear with coarse breath sounds, nonlabored. Cardiac: Regular rate and rhythm, no S3 gallop, soft basal systolic murmur. Abdomen: Soft, nontender, bowel sounds present, no bruits. Extremities: Trace ankle edema, mild venous stasis, distal pulses one plus. Skin: Warm and dry. Musculoskeletal: No kyphosis. Neuropsychiatric: Alert and oriented x3, affect appropriate.   EKG  Procedure date:  02/28/2010  Findings:      Sinus rhythm at 88 beats per minute with left anterior fascicular block and poor anterior R-wave progression.  Impression & Recommendations:  Problem # 1:  CORONARY ATHEROSCLEROSIS NATIVE CORONARY ARTERY (ICD-414.01)  Symptomatically stable on medical therapy. ECG reviewed. Plan clinical followup in 6 months, sooner if needed.  Her updated medication list for this problem includes:    Amlodipine Besylate 10 Mg Tabs (Amlodipine besylate) .Marland Kitchen... 1 by mouth once daily    Aspirin 81 Mg Tbec (Aspirin) .Marland Kitchen... 1 by mouth once daily  Her updated medication list for this problem includes:    Amlodipine Besylate 10 Mg Tabs (Amlodipine besylate) .Marland Kitchen... 1 by mouth once daily    Aspirin 81 Mg Tbec (Aspirin) .Marland Kitchen... 1 by mouth once daily  Problem # 2:  ESSENTIAL HYPERTENSION, BENIGN (ICD-401.1)  Blood pressure looks reasonable today.  Her updated medication list for this problem includes:    Amlodipine Besylate 10 Mg Tabs (Amlodipine besylate) .Marland Kitchen... 1 by mouth once daily    Aspirin 81 Mg Tbec (Aspirin) .Marland Kitchen... 1 by mouth once daily  Her updated medication list for this problem includes:    Amlodipine Besylate 10 Mg Tabs (Amlodipine besylate) .Marland Kitchen... 1 by mouth once daily    Aspirin 81 Mg Tbec  (Aspirin) .Marland Kitchen... 1 by mouth once daily  Problem # 3:  RENAL ARTERY STENOSIS (ICD-440.1)  Renal function normal based on labs from April. Blood pressure is also fairly well-controlled. She has not however undergone followup renal artery duplex imaging in some time. This will be scheduled for new baseline.  Orders: Renal Artery Duplex (Renal Artery Duplex)  Problem # 4:  HYPERLIPIDEMIA (ICD-272.4)  Plan to discontinue simvastatin and initiate Lipitor 80 mg daily in light of FDA dosing recommendations and concurrent use of amlodipine. Followup fasting lipid profile and liver function tests will be obtained.  Her updated medication list for this problem includes:    Simvastatin 80 Mg Tabs (Simvastatin) .Marland Kitchen... 1 by mouth once daily  Future Orders: T-Lipid Profile ([id number]) ... 03/03/2010 T-Hepatic Function [id number]) ... 03/03/2010  Her updated medication list for this problem includes:    Simvastatin 80 Mg Tabs (  Simvastatin) .Marland Kitchen... 1 by mouth once daily  Patient Instructions: 1)  Your physician recommends that you schedule a follow-up appointment in: 6 months 2)  Your physician recommends that you return for lab work in: Monday 3)  Your physician has recommended you make the following change in your medication: Stop taking Simvastatin and start taking Lipitor 80mg  by mouth at bedtime  4)  Your physician has requested that you have a renal artery duplex. During this test, an ultrasound is used to evaluate blood flow to the kidneys. Allow one hour for this exam. Do not eat after midnight the day before and avoid carbonated beverages. Take your medications as you usually do.

## 2010-06-17 NOTE — Letter (Signed)
Summary: [place] Results Engineer, agricultural at [facility]  [address] [place] [address]   Phone: [phone]  Fax: [phone]      March 25, 2010 MRN: [id number]   [name] [address] [place], [place]  [address]   Dear [name],  Your test ordered by Selena Batten has been reviewed by your physician (or physician assistant) and was found to be normal or stable. Your physician (or physician assistant) felt no changes were needed at this time.  ____ Echocardiogram  ____ Cardiac Stress Test  ____ Lab Work  ____ Peripheral vascular study of arms, legs or neck  ____ CT scan or X-ray  ____ Lung or Breathing test  __X__ Other: Renal Artery Duplex   Thank you.   Nona Dell, MD, F.A.C.C

## 2010-06-17 NOTE — Letter (Signed)
Summary: rpc chart  rpc chart   Imported By: Curtis Sites 02/27/2010 15:52:20  _____________________________________________________________________  External Attachment:    Type:   Image     Comment:   External Document

## 2010-06-17 NOTE — Miscellaneous (Signed)
Summary: Orders Update  Clinical Lists Changes  Orders: Added new Test order of Renal Artery Duplex (Renal Artery Duplex) - Signed 

## 2010-06-17 NOTE — Miscellaneous (Signed)
Summary: labs bmp,lipid,liver,03/05/2009  Clinical Lists Changes  Observations: Added new observation of CALCIUM: 9.4 mg/dL (98/03/9146 8:29) Added new observation of ALBUMIN: 4.2 g/dL (56/21/3086 5:78) Added new observation of PROTEIN, TOT: 7.1 g/dL (46/96/2952 8:41) Added new observation of SGPT (ALT): 12 units/L (03/05/2009 9:26) Added new observation of SGOT (AST): 20 units/L (03/05/2009 9:26) Added new observation of ALK PHOS: 140 units/L (03/05/2009 9:26) Added new observation of BILI DIRECT: <0.1 mg/dL (32/44/0102 7:25) Added new observation of CREATININE: 1.03 mg/dL (36/64/4034 7:42) Added new observation of BUN: 17 mg/dL (59/56/3875 6:43) Added new observation of BG RANDOM: 91 mg/dL (32/95/1884 1:66) Added new observation of CO2 PLSM/SER: 22 meq/L (03/05/2009 9:26) Added new observation of CL SERUM: 107 meq/L (03/05/2009 9:26) Added new observation of K SERUM: 4.5 meq/L (03/05/2009 9:26) Added new observation of NA: 142 meq/L (03/05/2009 9:26) Added new observation of LDL: 103 mg/dL (11/15/1599 0:93) Added new observation of HDL: 41 mg/dL (23/55/7322 0:25) Added new observation of TRIGLYC TOT: 245 mg/dL (42/70/6237 6:28) Added new observation of CHOLESTEROL: 193 mg/dL (31/51/7616 0:73)

## 2010-06-17 NOTE — Assessment & Plan Note (Signed)
Summary: f43m      Allergies Added: NKDA  Visit Type:  Follow-up Primary Provider:  Dr. Kyra Manges   History of Present Illness: 55 year old woman presents for a followup visit. She denies any significant angina, palpitations, or unusual breathlessness. She continues to use a rolling walker for ambulation. She denies any recent falls.  Followup labs from October 2010 revealed ALT 12, AST 20, BUN 17, creatinine 1.0, potassium 4.5, LDL 103, HDL 41, triglycerides 245, total cholesterol 193.  She states she had a recent routine visit with her primary care physician, and had labs obtained late last week.  She reports compliance with her medications. Blood pressure is elevated today compared to last visit. She does not check it regularly at home. Prior cardiac catheterization and subsequent echocardiogram results are outlined below.  Current Medications (verified): 1)  Amlodipine Besylate 10 Mg Tabs (Amlodipine Besylate) .Marland Kitchen.. 1 By Mouth Once Daily 2)  Aspirin 81 Mg Tbec (Aspirin) .Marland Kitchen.. 1 By Mouth Once Daily 3)  Simvastatin 80 Mg Tabs (Simvastatin) .Marland Kitchen.. 1 By Mouth Once Daily 4)  Omeprazole 20 Mg Cpdr (Omeprazole) .Marland Kitchen.. 1 By Mouth Once Daily 5)  Wheeled Walker With Seat  Allergies (verified): No Known Drug Allergies  Past History:  Past Medical History: Last updated: 02/26/2009 Hypertension Myocardial infarction Cerebrovascular accident, hx of-left leg weakness Urinary incontinence OAB CKD-stage 3 Left renal artery stenosis, stent on right Nasopharyngeal mass-benign-prob. Thornwalds cyst Coronary artery disease, occluded LAD (stent SEHV) with collaterals Reactive airways disease  Social History: Last updated: 02/26/2009 Single Never Smoked Drug use-yes-hx cocaine abuse Alcohol use-no quit denies heavy use   Clinical Review Panels:  Echocardiogram Echocardiogram  SUMMARY   -  Overall left ventricular systolic function was normal. Left         ventricular ejection  fraction was estimated , range being 55         % to 60 %. There was akinesis of the apical inferoseptal         wall. Left ventricular wall thickness was mildly increased.         Features were consistent with mild diastolic dysfunction.   -  Left atrial size was at the upper limits of normal.   -  The estimated peak pulmonary artery systolic pressure was mildly         increased. (06/25/2008)  Cardiac Imaging Cardiac Cath Findings  The left main coronary artery was normal.      There was a high takeoff diagonal or intermediate branch, which had a   30% ostial stenosis.      Stent at the proximal LAD was 100% occluded.      The LAD itself had recently good collaterals.  There were left-to-left   and also right-to-left.  Circumflex coronary artery was nondominant.  It   was a large caliber vessel.  There were 2 large obtuse marginal   branches, which were normal.      The right coronary artery was dominant.  It was normal.  There an RV   groove branch with an 80% ostial lesion.      The PDA provided nice septal collaterals to the LAD.      RAO ventriculography, RAO ventriculography showed anterior apical   hypokinesis with an EF of 45%.  There is no mural apical clot.  There is   no MR.  LV pressure was 119/15, and aortic pressure was 120/80. (09/09/2007)    Review of Systems  The patient denies anorexia, weight loss,  chest pain, syncope, peripheral edema, prolonged cough, headaches, melena, and hematochezia.         Otherwise reviewed and negative.  Vital Signs:  Patient profile:   55 year old female Weight:      159 pounds Pulse rate:   85 / minute BP sitting:   143 / 92  (right arm)  Vitals Entered By: Dreama Saa, CNA (September 03, 2009 3:10 PM)  Physical Exam  Additional Exam:  Chronically ill-appearing woman, in no acute distress. HEENT: Conjunctiva and lids normal, oropharynx with poor dentition. Neck: Supple, no loud carotid bruits or elevated jugular venous  pressure. No thyromegaly. Lungs: Clear with coarse breath sounds, nonlabored. Cardiac: Regular rate and rhythm, no S3 gallop, soft basal systolic murmur. Abdomen: Soft, nontender, bowel sounds present, no bruits. Extremities: Trace ankle edema, mild venous stasis, distal pulses one plus. Skin: Warm and dry. Musculoskeletal: No kyphosis. Neuropsychiatric: Alert and oriented x3, affect appropriate.   EKG  Procedure date:  09/03/2009  Findings:      Sinus rhythm at 78 beats per minutes with nonspecific T-wave changes.  Impression & Recommendations:  Problem # 1:  CORONARY ATHEROSCLEROSIS NATIVE CORONARY ARTERY (ICD-414.01)  Symptomatically stable with prior documentation of an occluded LAD, associated with good collateralization, and otherwise no significant obstruction in the remaining major epicardial vessels. LVEF was recently documented at 55-60% with apical inferoseptal akinesis. Plan to continue medical therapy with 6 month followup visit scheduled.  Her updated medication list for this problem includes:    Amlodipine Besylate 10 Mg Tabs (Amlodipine besylate) .Marland Kitchen... 1 by mouth once daily    Aspirin 81 Mg Tbec (Aspirin) .Marland Kitchen... 1 by mouth once daily  Problem # 2:  RENAL ARTERY STENOSIS (ICD-440.1)  Renal function normal when last assessed in October 2010. Review recent labs obtained at routine physical. She is status post right renal stent placement. Will consider followup duplex imaging around the time of her next visit.  Problem # 3:  ESSENTIAL HYPERTENSION, BENIGN (ICD-401.1)  Blood pressure suboptimal today. We discussed this, including sodium restriction. She states that she will have her blood pressure checked between visits, and if this trend continues, she may well need an additional medication. Norvasc is at an optimal dose.  Her updated medication list for this problem includes:    Amlodipine Besylate 10 Mg Tabs (Amlodipine besylate) .Marland Kitchen... 1 by mouth once daily     Aspirin 81 Mg Tbec (Aspirin) .Marland Kitchen... 1 by mouth once daily  Problem # 4:  HYPERLIPIDEMIA (ICD-272.4)  Will request most recent labs for review.  Her updated medication list for this problem includes:    Simvastatin 80 Mg Tabs (Simvastatin) .Marland Kitchen... 1 by mouth once daily  Patient Instructions: 1)  Your physician recommends that you schedule a follow-up appointment in: 6 months 2)  Your physician recommends that you continue on your current medications as directed. Please refer to the Current Medication list given to you today.

## 2010-06-17 NOTE — Miscellaneous (Signed)
Summary: medications update  Clinical Lists Changes  Medications: Changed medication from SIMVASTATIN 80 MG TABS (SIMVASTATIN) 1 by mouth once daily to LIPITOR 80 MG TABS (ATORVASTATIN CALCIUM) take 1 tablet by mouth at bedtime - Signed Rx of LIPITOR 80 MG TABS (ATORVASTATIN CALCIUM) take 1 tablet by mouth at bedtime;  #30 x 6;  Signed;  Entered by: Larita Fife Via LPN;  Authorized by: Loreli Slot, MD, [facility];  Method used: Electronically to [facility] [address] [phone]*, [address], [place], [place]  [address], Ph: [phone], Fax: [phone] Orders: Added new Test order of T-Lipid Profile [id number]) - Signed Added new Test order of T-Hepatic Function [id number]) - Signed    Prescriptions: LIPITOR 80 MG TABS (ATORVASTATIN CALCIUM) take 1 tablet by mouth at bedtime  #30 x 6   Entered by:   Larita Fife Via LPN   Authorized by:   Loreli Slot, MD, [facility]   Signed by:   Larita Fife Via LPN on 02/72/5366   Method used:   Electronically to        [facility] [phone]* (retail)       [address] [place]  [address]       Ph: [phone]       Fax: [phone]   RxID:   [id number]

## 2010-06-19 NOTE — Letter (Signed)
Summary: [place] [facility] Work Engineer, agricultural at [facility]  [address] [place] [address]   Phone: [phone]  Fax: [phone]     June 03, 2010 MRN: [id number]   TRENNA KIELY [address] [place], [place]  [address]      YOUR LAB WORK IS DUE  August 26, 2010 _________________________________________  Please go to [facility], located across the street from [facility] on the second floor.  Hours are Monday - Friday 7am until 7:30pm         Saturday 8am until 12noon    _X_  DO NOT EAT OR DRINK AFTER MIDNIGHT EVENING PRIOR TO LABWORK  __ YOUR LABWORK IS NOT FASTING --YOU MAY EAT PRIOR TO LABWORK

## 2010-06-19 NOTE — Progress Notes (Signed)
Summary: LIPITOR IS MAKING PT SICK   Phone Note Call from Patient Call back at Home Phone [phone]   Caller: PT Reason for Call: Talk to Nurse Summary of Call: PT STATES THAT SHE WAS PUT ON LIPITOR ABOUT A MONTH AGO AND IT IS CAUSING HER TO STAY SICK ON HER STOMIC AND HAVE DIARREAH Initial call taken by: Faythe Ghee,  May 14, 2010 3:12 PM  Follow-up for Phone Call        S: Pt. c/o being "sick on her stomach". B: On last OV with Dr. Diona Browner on 03-03-10, pt. was advised to stop Simvastatin and start taking Lipitor 80mg  by mouth at bedtime.  A: Pt. states she has been sick for 2 to 3 weeks. She states she was seen by a NP, Belva Agee, at Dr. Lavon Paganini office recently and was advised  that it is unlikely that Lipitor is causing her sickness. Pt. insist that the Lipitor is making her sick and wants to know if she can take something else for high Cholesterol. Please advise. R: Will contact pt. with Dr. Ival Bible recommendations. Follow-up by: Larita Fife Via LPN,  May 14, 2010 3:43 PM  Additional Follow-up for Phone Call Additional follow up Details #1::        Recommend that she hold Lipitor and see if her symptoms resolve. If this is the case, we could have her try half the dose (40 mg daily) to see if she tolerates it better. Otherwise we could try a different statin. Additional Follow-up by: Loreli Slot, MD, [facility],  May 15, 2010 2:51 PM    Additional Follow-up for Phone Call Additional follow up Details #2::    Pt. advised and states she understands instructions given. Pt. states she will call office on Monday to let us know if symptoms have resolved or not.  Follow-up by: Larita Fife Via LPN,  May 15, 2010 3:42 PM   Appended Document: LIPITOR IS MAKING PT SICK Medications Added LIPITOR 40 MG TABS (ATORVASTATIN CALCIUM) take 1 tablet by mouth once daily          Phone Note Outgoing Call   Call placed by: Larita Fife Via LPN,  May 20, 2010 9:49  AM Summary of Call: Pt. states she has been feeling better and has no complaints of stomach discomfort or diarrhea since her last Lipitor dose on 05-13-10. Pt. advised to take 1/2 tablet (40mg ) of Lipitor and that if symptoms return to call office. She states she understands instructions given. Initial call taken by: Larita Fife Via LPN,  May 20, 2010 9:54 AM    New/Updated Medications: LIPITOR 40 MG TABS (ATORVASTATIN CALCIUM) take 1 tablet by mouth once daily   Appended Document: LIPITOR IS MAKING PT SICK Reviewed. Continue present dose of Lipitor - decreased to 40 mg daily in December. LFT's are OK and LDL 92.  If she continues to tolerate dose, we can f/u labs after 12 weeks.  Appended Document: LIPITOR IS MAKING PT SICK     Phone Note Outgoing Call   Reason for Call: Discuss lab or test results Summary of Call: Pt. states she is tolerating Lipitor 40mg  by mouth at bedtime. Pt. advised of lab results and lab work in 12 weeks, she states she understands instructions given. Initial call taken by: Larita Fife Via LPN,  June 03, 2010 12:05 PM

## 2010-07-21 ENCOUNTER — Emergency Department (HOSPITAL_COMMUNITY): Payer: Medicare Other

## 2010-07-21 ENCOUNTER — Emergency Department (HOSPITAL_COMMUNITY)
Admission: EM | Admit: 2010-07-21 | Discharge: 2010-07-21 | Disposition: A | Discharge status: Home / Self Care | Payer: Medicare Other | Attending: Emergency Medicine | Admitting: Emergency Medicine

## 2010-07-21 DIAGNOSIS — S20229A Contusion of unspecified back wall of thorax, initial encounter: Secondary | ICD-10-CM | POA: Insufficient documentation

## 2010-07-21 DIAGNOSIS — IMO0002 Reserved for concepts with insufficient information to code with codable children: Secondary | ICD-10-CM | POA: Insufficient documentation

## 2010-07-21 DIAGNOSIS — Y92009 Unspecified place in unspecified non-institutional (private) residence as the place of occurrence of the external cause: Secondary | ICD-10-CM | POA: Insufficient documentation

## 2010-08-26 ENCOUNTER — Other Ambulatory Visit: Payer: Self-pay | Admitting: Cardiology

## 2010-08-26 LAB — HEPATIC FUNCTION PANEL
ALT: 14 U/L (ref 0–35)
AST: 19 U/L (ref 0–37)
Bilirubin, Direct: <0.1 mg/dL (ref 0.0–0.3)
Total Bilirubin: 0.3 mg/dL (ref 0.3–1.2)

## 2010-08-26 LAB — LIPID PANEL: Cholesterol: 171 mg/dL (ref 0–200)

## 2010-09-03 ENCOUNTER — Encounter: Payer: Self-pay | Admitting: Cardiology

## 2010-09-03 ENCOUNTER — Ambulatory Visit (INDEPENDENT_AMBULATORY_CARE_PROVIDER_SITE_OTHER): Payer: Medicare Other | Admitting: Cardiology

## 2010-09-03 VITALS — BP 122/87 | HR 80 | Ht 61.0 in | Wt 162.0 lb

## 2010-09-03 DIAGNOSIS — I2510 Atherosclerotic heart disease of native coronary artery without angina pectoris: Secondary | ICD-10-CM

## 2010-09-03 DIAGNOSIS — E782 Mixed hyperlipidemia: Secondary | ICD-10-CM

## 2010-09-03 DIAGNOSIS — I701 Atherosclerosis of renal artery: Secondary | ICD-10-CM

## 2010-09-03 DIAGNOSIS — I10 Essential (primary) hypertension: Secondary | ICD-10-CM

## 2010-09-03 NOTE — Patient Instructions (Signed)
**Note De-Identified [name] Obfuscation** Your physician recommends that you return for lab work in: 6 months, just before next office visit.  Your physician recommends that you continue on your current medications as directed. Please refer to the Current Medication list given to you today.  Your physician recommends that you schedule a follow-up appointment in: 6 months

## 2010-09-03 NOTE — Assessment & Plan Note (Signed)
Blood pressure is well-controlled today. 

## 2010-09-03 NOTE — Progress Notes (Signed)
Clinical Summary [name] is a 55 y.o.female presenting for followup. She was last seen in October 2001.  She denies any angina or progressive shortness of breath. Uses a walker to ambulate.  Lab work from October 2011 showed cholesterol 190, triglycerides 189, HDL 49, LDL 103, AST 17, ALT 16. Renal artery duplex scan from November 2011 showed normal caliber abdominal aorta, symmetrical kidney size, normal renal arteries bilaterally. We reviewed these studies.  Lipitor dose was cut to 40 mg daily, which she reports tolerating. Followup labs from April 10 showed cholesterol 171, triglycerides 253, HDL 45, LDL 75, AST 19, ALT 14.  ECG is reviewed below.  No Known Allergies  Current outpatient prescriptions:amLODipine (NORVASC) 10 MG tablet, Take 10 mg by mouth daily.  , Disp: , Rfl: ;  aspirin 81 MG tablet, Take 81 mg by mouth daily.  , Disp: , Rfl: ;  atorvastatin (LIPITOR) 80 MG tablet, Take 80 mg by mouth daily. Take 1/2 tab, Disp: , Rfl: ;  omeprazole (PRILOSEC) 20 MG capsule, Take 20 mg by mouth daily.  , Disp: , Rfl:   Past Medical History  Diagnosis Date  . Hypertension   . Myocardial infarction   . CVA (cerebral vascular accident)   . Urinary incontinence   . CKD (chronic kidney disease)     Stage 3  . Renal artery stenosis     Bilateral, right stent  . 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 Coronary atherosclerosis of native coronary artery     Occluded LAD (stent SEHV) with collaterals  . Asthmatic bronchitis     Social History [name] reports that she has never smoked. She has never used smokeless tobacco. [name] reports that she does not drink alcohol.  Review of Systems As outlined above, otherwise reviewed and negative.  Physical Examination Filed Vitals:   09/03/10 1015  BP: 122/87  Pulse: 80   Chronically ill-appearing woman, in no acute distress. HEENT: Conjunctiva and lids normal, oropharynx with poor dentition. Neck: Supple,  no loud carotid bruits or elevated jugular venous pressure. No thyromegaly. Lungs: Clear with coarse breath sounds, nonlabored. Cardiac: Regular rate and rhythm, no S3 gallop, soft basal systolic murmur. Abdomen: Soft, nontender, bowel sounds present, no bruits. Extremities: Trace ankle edema, mild venous stasis, distal pulses one plus. Skin: Warm and dry. Musculoskeletal: No kyphosis. Neuropsychiatric: Alert and oriented x3, affect appropriate.  ECG Normal sinus rhythm at 70 beats per minute with LVH, evidence of prior anteroseptal infarct.  Studies Cardiac catheterization 09/09/2007: The left main coronary artery was normal.      There was a high takeoff diagonal or intermediate branch, which had a   30% ostial stenosis.      Stent at the proximal LAD was 100% occluded.      The LAD itself had recently good collaterals.  There were left-to-left   and also right-to-left.  Circumflex coronary artery was nondominant.  It   was a large caliber vessel.  There were 2 large obtuse marginal   branches, which were normal.      The right coronary artery was dominant.  It was normal.  There an RV   groove branch with an 80% ostial lesion.      The PDA provided nice septal collaterals to the LAD.      RAO ventriculography, showed anterior apical   hypokinesis with an EF of 45%.  There is no mural apical clot.  There is  no MR.  LV pressure was 119/15, and aortic pressure was 120/80.  Problem List and Plan

## 2010-09-03 NOTE — Assessment & Plan Note (Signed)
Interval duplex scan was normal.

## 2010-09-03 NOTE — Assessment & Plan Note (Signed)
No active angina, ECG stable. Continue medical therapy.

## 2010-09-03 NOTE — Assessment & Plan Note (Signed)
Lipid numbers improving, LDL now at goal. Continue present dose of Lipitor with followup lipid profile and liver function tests for her next visit.

## 2010-09-22 NOTE — Progress Notes (Signed)
Addended by: Teressa Lower on: 09/22/2010 01:57 PM   Modules accepted: Orders

## 2010-09-30 NOTE — Assessment & Plan Note (Signed)
[facility]                       [place] [facility]ARDI[facility] OFFICE NOTE   NAME:[name], [name]                        MRN:          [id number]  DATE:08/18/2007                            DOB:          11-Jun-1955    [name] is a 51-year patient of Dr. Jen Mow.  She is referred back for  dizzy spells with a history of an MI, hypertension,  hypercholesterolemia.   The patient has previously been seen by Dr. Elsie Lincoln.  She had  intervention to her LAD with cutting balloon angioplasty by Dr. Elsie Lincoln  in 2003.  She has not had a close followup.   The patient also has a history of renal artery stenosis with a stent in  2006, hypertension, hypercholesterolemia.  She, unfortunately,  previously suffered a stroke.  It has left her with some weakness in the  left side and difficulty with her balance.  The particular episode she  had, she felt dizzy on prolonged standing, felt very warm and flushed,  and then slumped to the ground.  There was no seizure activity.  There  are no palpitations, chest pain, PND, or orthopnea.  She tends not to  get recurrent episodes but she does get a little wobbly on her feet.  She appears to have disequilibrium since her stroke.   She denies any new visual problems or hearing problems.   She denies a history of seizures and is not on any medication.  From a  cardiac perspective, she has not had a recent workup.   There has been no history of VT or arrhythmia and her LV function has  been normal in the past.   REVIEW OF SYSTEMS:  Otherwise negative.   PAST MEDICAL HISTORY:  Remarkable for renovascular disease with stent in  2006, coronary artery disease with stent in 2003, previous tubal  ligation, previous CVA.   The patient has no known allergies.  She does not smoke or drink.   She is happily married for 20 years.  Unfortunately, her 2 older sons  left and went to [place].  She has not seen them in 6 years.  She does  all  activities of daily living.  She wears a brace on her left lower  extremity for balance and to prevent any injury after her stroke.   The patient's review of systems otherwise remarkable for some difficulty  with constipation and some rawness to the scan in her buttocks.   FAMILY HISTORY:  Remarkable for massive MI in her father and a CABG and  MI in mother's side.   The patient is otherwise disabled.  She has a boyfriend and lives with  him and 2 children.   EXAM:  Remarkable for a somewhat disheveled and middle-aged white female  with poor hygiene.  Her blood pressure is 120/90, pulse 60 and regular, weight 146,  respiratory rate 14, afebrile.  She does have a bit of aphasia.  HEENT:  Unremarkable.  Carotids normal without bruit.  No lymphadenopathy, thyromegaly, JVP  elevation.  LUNGS:  Clear diaphragmatic motion.  No wheezing.  S1-S2 heart sounds.  PMI  normal.  ABDOMEN:  Benign bowel sounds positive.  No AAA.  No tenderness.  No  hepatosplenomegaly.  No hepatojugular reflux.  No tenderness.  No bruit.  Distal pulses intact.  Trace edema.  She has left-sided weakness in the left upper extremity and left lower  extremity.  Neuro is otherwise nonfocal.  Skin is warm and dry.  No muscular weakness.   EKG shows sinus rhythm with somewhat poor R wave progression.  No acute  changes.   IMPRESSION:  1. Coronary artery disease, previous stent to the left anterior      descending.  Diagonal with residual disease.  Followup adenosine      Myoview.  2. Dizziness, one episode of passing out.  Does not sound cardiac in      nature.  Will reassess for ischemia and LV function by Myoview.  3. History of hypertension with left renal stent.  Needs followup      abdominal duplex to assess kidney sizes and patency of stents.      Blood pressure seems under reasonable control with amlodipine.  4. Hypercholesterolemia in the setting of vascular disease with      previous CVA and coronary  disease.  Continue simvastatin 40 a day,      lipid and liver profile in 6 months.  5. Previous cerebrovascular accident.  Seems to be coping well.      Continue wear a brace on left lower extremity.  No need for further      PT and OT.  I will see the patient in a year unless her stent is      having a problem or she has an abnormal Myoview.     Noralyn Pick. Eden Emms, MD, [facility]  Electronically Signed    PCN/MedQ  DD: 08/18/2007  DT: 08/18/2007  Job #: [id number]   cc:   Erle Crocker, M.D.

## 2010-09-30 NOTE — Cardiovascular Report (Signed)
[name], [name]                 ACCOUNT NO.:  [id number]   MEDICAL RECORD NO.:  [id number]          PATIENT TYPE:  OIB   LOCATION:  1962                         FACILITY:  [facility]   PHYSICIAN:  Peter C. Eden Emms, MD, FACCDATE OF BIRTH:  1955-06-20   DATE OF PROCEDURE:  DATE OF DISCHARGE:                            CARDIAC CATHETERIZATION   INDICATIONS:  This is a 55 year old patient with abnormal Myoview,  anterior apical infarct with peri-infarct ischemia, and history of stent  to the LAD by Dr. Orvan Falconer.   Cine catheterization done, 4-French catheters from right femoral artery.   The left main coronary artery was normal.   There was a high takeoff diagonal or intermediate branch, which had a  30% ostial stenosis.   Stent at the proximal LAD was 100% occluded.   The LAD itself had recently good collaterals.  There were left-to-left  and also right-to-left.  Circumflex coronary artery was nondominant.  It  was a large caliber vessel.  There were 2 large obtuse marginal  branches, which were normal.   The right coronary artery was dominant.  It was normal.  There an RV  groove branch with an 80% ostial lesion.   The PDA provided nice septal collaterals to the LAD.   RAO ventriculography, RAO ventriculography showed anterior apical  hypokinesis with an EF of 45%.  There is no mural apical clot.  There is  no MR.  LV pressure was 119/15, and aortic pressure was 120/80.   IMPRESSION:  The patient has single-vessel disease.  She has a chronic  occlusion with an old stent.  It is not particularly favorable for  recanalization.  She has good right-to-left and left-to-left  collaterals.  Continued medical therapy is warranted.  The patient  tolerated the procedure well.  She has significant anxiety depressive  order.  She was given a total of 5 mg of Versed during the case.   Note should be made that in the future, a JL-5 catheter would probably  engage the left main  better.      [name]. Eden Emms, MD, [facility]  Electronically Signed     PCN/MEDQ  D:  09/09/2007  T:  09/09/2007  Job:  [id number]   cc:   Cecil Cranker, MD, [facility]

## 2010-09-30 NOTE — Assessment & Plan Note (Signed)
[facility]                       [place] CARDIOLOG[facility] OFFICE NOTE   NAME:Jake, JENNIFE Odom                        MRN:          [id number]  DATE:11/21/2007                            DOB:          09-24-1955    HISTORY:  [name] returns today in followup.  She has history of stenting  of the LAD by Dr. Elsie Lincoln many years ago.  She had an abnormal Myoview  with anterior apical infarct with peri-infarct ischemia, subsequently  had a heart cath done by myself which was done on September 09, 2007.  At  what point, she had a totally occluded LAD which was well collateralized  both left-to-left and right-to-left.  She had no critical disease in the  RCA and circ.   Medical therapy was warranted as the chronic total occlusion was not  favorable for revascularization.  Her EF was in the 45% range.  She has  done well.  She is not having significant chest pain.  Her ambulation is  limited by previous CVA with some left lower extremity weakness that she  wears a brace for.  Risk factors are otherwise well modified.  She needs  a followup with Dr. Jen Odom in 6 months to get her fasting sugar and  cholesterol checked.   SOCIAL HISTORY:  She is a nonsmoker.   REVIEW OF SYSTEMS:  The patient's review of systems is otherwise  negative.   MEDICATIONS:  She is on amlodipine 10 a day, simvastatin 40 a day, and  baby aspirin a day.   PHYSICAL EXAMINATION:  GENERAL:  Her exam is remarkable for a white  female looking older than her stated age.  Affect appropriate.  VITAL SIGNS:  Blood pressure 140/72, pulse 60 and regular, respiratory  rate 14, afebrile.  HEENT:  Unremarkable.  NECK:  Carotids are normal without bruit.  No lymphadenopathy,  thyromegaly, or JVP elevation.  LUNGS:  Clear with diaphragmatic motion.  No wheezing.  CARDIAC:  S1 and S2, normal heart sounds.  PMI normal.  ABDOMEN:  Benign.  Bowel sounds positive.  No bruit.  No tenderness.  No  hepatosplenomegaly  or hepatojugular reflux.  She does have a previous  history of a renal artery stent.  EXTREMITIES:  Distal pulses are intact.  No edema.  NEURO:  Nonfocal.  SKIN:  Warm and dry.  She has some left lower extremity weakness which  she wears a brace for.   IMPRESSION:  1. Coronary disease, previous stent to the LAD, now occluded with      collateralization, continue medical therapy.  2. Hypercholesterolemia in the setting coronary disease.  Continue      simvastatin.  Liver profile in 6 months.  3. Hypertension, previous left renal artery stent.  I do not recall      that this stent was injected during her heart cath, however, her      blood pressure is under good control with just amlodipine.  Her      creatinine prior to the cath was 1.1 and stable.  4. Previous cerebrovascular accident.  No recurrences.  No residual  carotid bruits.  Continue to wear a brace for left lower extremity.      Continue aspirin therapy.   The patient will follow up with my colleagues in [place] in 39-month.     [name]. [name], [name], [name]  Electronically Signed    PCN/MedQ  DD: 11/21/2007  DT: 11/22/2007  Job #: [id number]

## 2010-09-30 NOTE — Assessment & Plan Note (Signed)
[facility]                       [place] CARDIOLOGY OFFICE NOTE   NAME:[place], CHYREL TAHA                        MRN:          [id number]  DATE:06/21/2008                            DOB:          Dec 31, 1955    PRIMARY CARE PHYSICIAN:  Erle Crocker, MD   REASON FOR VISIT:  Cardiac followup.   HISTORY OF PRESENT ILLNESS:  This is my first meeting with Ms. Bey.  She previously was followed by Dr. Eden Emms and was last seen in July  2009.  She has a history of cardiovascular disease status post previous  stent placement to the left anterior descending by [facility], subsequently found to be totally occluded at  catheterization in April 2009, but associated with good  collateralization from both the left and the right.  She had no other  obstructive disease in the circumflex of right coronary artery and has  been managed medically.  Ejection fraction was 45%.  Ms. Umble denies  having any anginal symptoms, but has had some shortness of breath with  emotional upset.  I note that her Zocor was recently increased by Dr.  Jen Mow from 40 mg to 80 mg daily, and review of labs is pending at this  time.  She does have renal artery stenosis status post previous stent  placement to the right renal artery and her duplex scan from May 2009  revealed 1-59% stenosis of the right with widely patent stent, normal  symmetric kidney size, and normal-caliber abdominal aorta.  She reports  compliance with her medications.   ALLERGIES:  No known drug allergies.   MEDICATIONS:  1. Aspirin 81 mg p.o. daily.  2. Simvastatin 80 mg p.o. daily.  3. Amlodipine 10 mg p.o. daily.  4. Omeprazole 20 mg p.o. daily.  5. Tylenol p.r.n.   REVIEW OF SYSTEMS:  As outlined above.  She does describe some problems  with hot flashes in the evenings.  Otherwise negative.   PHYSICAL EXAMINATION:  VITAL SIGNS:  Blood pressure 120/90, heart rate  is 60, and  weight is 152 pounds.  GENERAL:  The patient is comfortable in no acute distress.  HEENT:  Conjunctivae are normal.  Oropharynx clear with poor dentition.  NECK:  Supple.  No elevated jugular venous pressure.  No loud bruits.  No thyromegaly in noted.  LUNGS:  Clear without labored breathing at rest.  CARDIAC:  Regular rate and rhythm.  Soft basal systolic murmur.  No  pericardial rub or S3 gallop.  PMI is indistinct.  ABDOMEN:  Soft and nontender.  Normoactive bowel sounds.  No bruits.  EXTREMITIES:  Exhibit no frank pitting edema.  Distal pulses are 1 to 2+  plus.  SKIN:  Warm and dry.  MUSCULOSKELETAL:  No kyphosis noted.  NEUROPSYCHIATRIC:  The patient is alert x3.  Affect is appropriate.   Electrocardiogram today shows normal sinus rhythm with decreased  anterior R-wave progression consistent with previous infarct.   IMPRESSION AND RECOMMENDATIONS:  1. Cardiovascular disease with known occlusion of left anterior      descending, well collateralized from the right and  left, status      post previous stent placed to left anterior descending.  Last      assessment of ejection fraction was 45%.  She is not reporting any      anginal symptoms, but some shortness of breath with emotional      upset.  She states that she was recently been given a inhaler and      has yet to try this.  We will follow up with a 2-D echocardiogram      to reassess cardiac structure and function.  If ejection fraction      remains decreased, she might benefit from further medication      adjustments, possibly ACE inhibitor or a low-dose beta-blocker.  2. Hyperlipidemia.  On simvastatin, recently increased from 40 mg to      80 mg daily.  We will obtain labs from Dr. Jen Mow.  Ideally, her LDL      should be around 70.  3. Known history of renal artery stenosis status post stent placement      at the right renal artery.  She will be due for followup duplex      scanning in May 2010.  4. Clinical followup  will be over the next 6 months.     Jonelle Sidle, MD  Electronically Signed    SGM/MedQ  DD: 06/21/2008  DT: 06/22/2008  Job #: [id number]   cc:   Erle Crocker, M.D.

## 2010-09-30 NOTE — Assessment & Plan Note (Signed)
[facility]                       [place] [facility] OFFICE NOTE   NAME:[name], [name]                        MRN:          [id number]  DATE:09/07/2007                            DOB:          29-Dec-1955    [name] was seen today in followup.  She has a distant history of  angioplasty and stenting of the LAD in 2003 by Dr. Elsie Lincoln.  She had  residual 90% diagonal disease.  She had 2 areas dilated.   The patient had an episode of presyncope.  Her Myoview study was  abnormal, with an anterior apical wall infarct and moderate peri-infarct  ischemia.  I brought [name] back to talk to her about doing a repeat  heart catheterization.  I was concerned that her presyncope may be  arrhythmia related to an ischemic source.   She had residual diagonal disease, and her stents may be reoccluding.  She is very inactive due to her previous stroke and left lower extremity  weakness.  She has not had much in the way of exertional chest pain, but  I am still concerned about her Myoview results.  She is very emotional  today.  She was crying.  She has significant anxiety and depression.  We  talked about doing the heart catheterization at length, including the  risks of recurrent stroke, contrast reaction, bleeding and need for  emergency surgery.  I think [name]'s biggest fear is that she will need  open heart surgery.  I explained to her that this is a possibility; but  more likely we could fix things with a stent or continue to treat her  medically.   REVIEW OF SYSTEMS:  Otherwise negative.   CURRENT MEDICATIONS:  1. Amlodipine 10 mg daily.  2. Simvastatin 40 mg daily.  3. Aspirin daily.  4. She will be given a prescription for nitroglycerin.   We will check her lab work and chest x-ray today.   I will also start her on 20 mg Prozac and give her a prescription for  Xanax 0.5 b.i.d. to start prior to her catheterization.  I reassured her  that we would make  her extremely comfortable during the case, including  IV Versed.   PHYSICAL EXAMINATION:  Remarkable for an emotional and crying white  female.  Blood pressure 140/60, pulse 64 and regular, weight 151,  respiratory rate 14, afebrile.  HEENT:  Unremarkable.  NECK:  Carotids are normal without bruit.  No lymphadenopathy,  thyromegaly or JVP elevation.  LUNGS:  Clear, with good diaphragmatic motion.  No wheezing.  CARDIOVASCULAR:  S1 and S2 with normal heart sounds.  PMI normal.  ABDOMEN:  Bowel sounds positive.  No AAA.  No bruit.  No  hepatosplenomegaly.  EXTREMITIES:  Reflexes and distal pulses intact.  No edema.  NEUROLOGIC:  She has left lower extremity weakness with a brace.  SKIN:  Warm and dry.  No muscular weakness other than the left lower  extremity.   IMPRESSION:  1. Coronary disease.  Previous stent to the LAD by Dr. Elsie Lincoln in 2003.      Residual diagonal  disease; abnormal Myoview.  Heart catheterization      to be arranged.  2. Hypercholesterolemia.  Continue simvastatin 40 mg a day.  Lipid and      liver profile in 6 months.  3. Hypertension.  Currently well-controlled.  If she has restenosis      will probably rethink the amlodipine and switch her over to a beta      blocker, although her resting heart rate is already in the 60s.  4. Anxiety and Depression.  Start Prozac and Xanax prior to heart      catheterization.  Further recommendations will be based on the      results of her catheterization.     [name]. Eden Emms, MD, [facility]  Electronically Signed    PCN/MedQ  DD: 09/07/2007  DT: 09/07/2007  Job #: [id number]

## 2010-10-03 NOTE — Cardiovascular Report (Signed)
NAME:  [name], [name]                           ACCOUNT NO.:  [id number]   MEDICAL RECORD NO.:  [id number]                   PATIENT TYPE:  INP   LOCATION:  2922                                 FACILITY:  [facility]   PHYSICIAN:  Madaline Savage, M.D.             DATE OF BIRTH:  Mar 28, 1956   DATE OF PROCEDURE:  03/27/2002  DATE OF DISCHARGE:                              CARDIAC CATHETERIZATION   PROCEDURES PERFORMED:  1. Selective coronary angiography by Judkins technique.  2. Retrograde left heart catheterization.  3. Left ventricular angiography.  4. Percutaneous intracoronary artery stenting of the proximal left anterior     descending.  5. Intracoronary Cutting Balloon angioplasty of the mid left anterior     descending.   COMPLICATIONS:  None.   ENTRY SITE:  Right femoral.   DYE USED:  Omnipaque.   MEDICATIONS GIVEN:  Intravenous Integrilin by double bolus technique and  then continuous infusion, intravenous nitroglycerin.  A total of 1500 units  of heparin was given to boost the ACT from 243 to 270.  Versed and fentanyl  were given for sedation twice.   PATIENT PROFILE:  The patient is a 55 year old white female from [place],  [place], who presented to the [facility] Emergency Room with chest  pain and was seen there by Dr. Hipolito Bayley. Dr. Rosalia Hammers assessed that the patient  had ST segment elevations in the anteroseptal leads and started both  nitroglycerin and heparin and after triage management called [place] and  transported her from the [facility] Emergency Room to [facility]. It should be  noted that the patient was pain-free by the time she left [facility] under  Dr. Denny Levy excellent care.   The patient had reported chest pain as far back as Saturday during the early  evening, and had more chest pain last night, which was Sunday night, and  ultimately came into the emergency room sometime today with waxing and  waning chest pain.   The patient has a strong family history  of coronary artery disease with both  mother and father having had bypass surgery and apparently two brothers have  also had bypass surgery.  The procedure was performed after the patient was  transferred directly to the catheterization lab by [facility].  The patient's  vital signs were stable upon arrival, although the blood pressure was a  little bit elevated.   RESULTS:  PRESSURES:  The central aortic pressure was 135/90 with a mean of  110.  Left ventricular pressure was 135/4, end-diastolic pressure 25.  No  aortic valve gradient by pullback technique.   ANGIOGRAPHIC RESULTS:  The left main coronary artery was large and normal.   The LAD contained an ulcerated plaque with an irregular lumen in the  proximal portion of the vessel near a small diagonal branch.  It was  approximately 5 mm in length.  However, the next 15 mm  of the LAD, which is  still in the proximal portion of the vessel was narrowed.  The mid LAD then  showed a normal lumen diameter near the origin of the septal perforator  branch, and then as the LAD coursed downward along the anterior wall, there  was an eccentric lesion of 75%.  This was between diagonal #2 and diagonal  #3.  The distal LAD was a medium to small vessel and was free of any high-  grade stenosis.   The left circumflex was a large vessel that gave rise to a very large obtuse  marginal branch which was normal and a very large distal circumflex and  posterolateral branch.  No lesions were seen in the entirety of the  circumflex and there was also no disease in the left main.   Right coronary artery was a dominant vessel giving rise to an acute marginal  branch that was diffusely diseased and 90% at the ostium.  The proximal, mid  and distal segments of the right coronary artery itself were not  significantly diseased.   The left ventricle showed anterolateral wall hypokinesis of moderate degree  and apical akinesis.  The inferior wall contracted  vigorously and the basal  segments were vigorous as well. I estimate ejection fraction qualitatively  at 40%.  There was no mitral regurgitation.  In the LAO view there was noted  to be septal hypokinesis as well.   The strategy for the intervention part of this case involved the LAD and its  two lesions proximal and mid.   The patient was given double bolus Integrilin and we confirmed an ACT of  270.  The guide catheter used was a 7 [place] left Judkins 4.  The guide wire  was a Patriot wire short in nature.  A pre-dilatation balloon was used in  the mid LAD. It was a 2.5 x 15 mm Maverick balloon inflated to 6 atmospheres  of pressure.  I then deployed a stent, mainly an Express II 3.0 x 24 mm  stent across the lesion and also further down the proximal LAD because of  the disparity between the size of the vessel and the diameter of the mid  LAD. This stent was deployed first to approximately 13 atmospheres and then  to 16 atmospheres corresponding to an estimated transluminal diameter of  3.37 and there was preservation of TIMI-3 flow and the lesion was reduced  from 90% to 0% residual.   Next, the mid LAD was approached using a 2.75 x 10 mm Cutting Balloon, two  inflations up to 6 and 7 atmospheres of pressure were performed on this  portion of the LAD and the 75% lesion was reduced to 10%.  TIMI-3 distal  flow was preserved.   FINAL DIAGNOSES:  1. Coronary artery disease primarily confined to the left anterior     descending and its diagonal branch (the ostial diagonal was 90% stenosed     before intervention but was not intervened upon because of small size).     a. A 90% proximal left anterior descending reduced to 0% residual with        intracoronary stenting.     b. A 75% mid left anterior descending stenosis reduced to 10% residual        with preservation of TIMI-3 flow with a Cutting Balloon.    c. Diagonal #1 branch of left anterior descending 90% without         intervention.  d.        Acute marginal branch of right coronary artery, 90% not intervened upon.   2. Left ventricular dysfunction, mild to moderate degree, 40% ejection     fraction involving both anterolateral wall and apex.                                                 Madaline Savage, M.D.    WHG/MEDQ  D:  03/27/2002  T:  03/28/2002  Job:  [id number]   cc:   Cardiac Catheterization Lab   Room [phone]

## 2010-10-03 NOTE — H&P (Signed)
[name], [name]                 ACCOUNT NO.:  [id number]   MEDICAL RECORD NO.:  [id number]          PATIENT TYPE:  EMS   LOCATION:  ED                            FACILITY:  [facility]   PHYSICIAN:  Calvert Cantor, M.D.     DATE OF BIRTH:  [birth year]   DATE OF ADMISSION:  08/31/2004  DATE OF DISCHARGE:  LH                                HISTORY & PHYSICAL   The patient does not have a primary care physician.   HISTORY OF PRESENT ILLNESS:  This is a 55 year old white female who  developed soreness in the right side of her scalp near her frontal area  about a week ago.  Subsequently she developed a rash.  She came in to the ER  and was diagnosed with herpes zoster and discharged home with oral  acyclovir.  The patient continued to take this medication for 2 days while  she was home.  However, her rash continued to spread over her forehead.  Today she awoke with her right eye swollen shut.  She was brought to the ER  by her husband.  Currently the patient is able to open her right eye.  The  swelling seems to have decreased.  She states that she is having some  tearing over her right eye as well.  However, she does not complain of any  blurred vision.  There is no eye pain.  There is no decrease in visual  acuity.  The patient does not complain of having this rash in her ear.  She  is not complaining of any headache or any neck stiffness.  She is not  complaining of any itching or burning in her eyes.   REVIEW OF SYSTEMS:  Other review of systems is negative for fever or chills  or sweats.  It is negative for sore throat.  It is negative for tinnitus or  hearing loss.  There are no complaints of shortness of breath, chest pain,  abdominal pain, dysuria or diarrhea.  There are no complaints of  disorientation or focal weakness.   PAST MEDICAL HISTORY:  Significant for:  1.  CAD, status post stent placement in 2003.  2.  A history of lacunar infarcts involving the pons and the internal  capsule with a history of vertebral artery stenosis.  3.  Hypertension.   PAST SURGICAL HISTORY:  1.  Significant for a cath in 2003.  2.  Tubal ligation.   ALLERGIES:  No known drug allergies.   SOCIAL HISTORY:  She is a nonsmoker, nonalcoholic.  She does not use any  other drugs.  She is unmarried.  She has two children who are alive and  healthy.  She currently lives with the father of these children.   FAMILY HISTORY:  The mother passed away at 69.  She had a history of CAD.  The father passed away at age 19 of an MI.  She has had two brothers who  have had CABG performed, one at the age of 42 and the other brother in his  29s.  She has one  sister with a history of a stroke in her 73s.  Therefore  she has  a significant family history of heart disease.   MEDICATIONS:  1.  Metoprolol 100 mg daily as per husband.  2.  She also takes Plavix 75 mg a day, which she has been taking since her      catheterization in 2003.  3.  In addition she takes ibuprofen intermittently when she has a headache.   PHYSICAL EXAMINATION:  GENERAL APPEARANCE:  The patient is awake and alert,  sitting up in her bed.  VITAL SIGNS:  Temperature 97, blood pressure 168/80, pulse 64, respiratory  rate 20.  Pulse oximetry is 100% on room air.  HEENT:  The patient has lesions on the right side of her scalp and forehead.  Some of these lesions are encrusted.  Others seem to be just appearing.  There is one vesicle that is noted which has not ruptured yet.  Her eyelids  are swollen.  However, she is able to open her eye.  There is no obvious  discharge from the eye.  Her conjunctivae are  pink without any lesions.  Her sclerae also are clear.  Extraocular movements are intact.  There are no  lesions in her external auditory canal on the right side or the left side.  Her oral mucosa is also free of lesions.  There is no erythema.  The oral  mucosa appears moist.  Pupils are equal, round and reactive to light  and  accommodation.  NECK:  Supple.  HEART:  Regular rate and rhythm.  No murmur.  LUNGS:  Clear bilaterally.  ABDOMEN:  Soft, nontender, nondistended.  Bowel sounds are positive.  EXTREMITIES:  Show no cyanosis, clubbing or edema.  The patient states that  she walks with a cane secondary to weakness in her left leg.  NEUROLOGICAL: Cranial nerves 2-12 are intact.  Her left leg appears to be  slightly weaker than her right.  Her upper extremities are 5/5, equal in  strength.   Blood work is pending.   ASSESSMENT/PLAN:  This is a 55 year old white female who developed about 1  week ago a herpes zoster infection which is involving her right ophthalmic  branch of the trigeminal nerve.  Currently it does not seem to be involving  her eye, and she is not complaining of any pain or blurring of vision, and  her extraocular movements are intact.  Her conjunctivae and sclerae are also  free of infection currently.  She has been taking acyclovir for the past few  days without any improvement and actual worsening of the lesions.  She is  going to be admitted and placed on valacyclovir 1000 mg p.o. t.i.d.  An  ophthalmology consultation will be placed.  She will be on contact  precautions, and her lesions will be covered with gauze.  Her outpatient  medications will be resumed.  Protonix 40 mg p.o. daily will be added.  She  will be started on IV fluids to maintain adequate hydration.  Blood work has  been ordered and will be followed up.  A culture of the vesicle was done in  the ER by Dr. Margretta Ditty which will also be followed up.      SR/MEDQ  D:  08/31/2004  T:  08/31/2004  Job:  [id number]

## 2010-10-03 NOTE — H&P (Signed)
[name], [name]                 ACCOUNT NO.:  [id number]   MEDICAL RECORD NO.:  [id number]          PATIENT TYPE:  INP   LOCATION:  5709                         FACILITY:  [facility]   PHYSICIAN:  Bevelyn Buckles. [place], M.D.DATE OF BIRTH:  1955/12/22   DATE OF ADMISSION:  04/01/2005  DATE OF DISCHARGE:                                HISTORY & PHYSICAL   HISTORY OF PRESENT ILLNESS:  This is a 55 year old Caucasian female past  medical history of hypertension, myocardial infarction, and strokes who  presents from an outside hospital with symptoms of difficulty with speech  and left lower extremity  weakness since last evening.  The patient noticed  some left lower extremity  weakness last evening and had a fall.  At that  time she also noticed some difficulty getting words out.  She went to sleep  last evening and woke up this morning and stated she could not speak at all  or get words out.  The patient noticed her left lower extremity was still  weak.  She had some difficulty swallowing and was unsteady on her feet.  Her  symptoms have gradually improved as the patient can communicate and express  some words at times, however, still has some difficulty expressing words.  The patient denies any headaches, tingling, numbness, vision changes,  chewing problems, vertigo, dizziness, or loss of consciousness.   PAST MEDICAL HISTORY:  1.  Positive for heart disease with a myocardial infarction.  2.  Strokes.  3.  High blood pressure.   CURRENT MEDICATIONS:  Plavix, baby aspirin, and Zestoretic.   ALLERGIES:  The patient has no known drug allergies.   FAMILY HISTORY:  Positive for heart disease, hypertension, and strokes.   SOCIAL HISTORY:  The patient currently lives with her boyfriend, is not  currently working, is a social drinker, did use cocaine last Sunday, and  denies any smoking.   REVIEW OF SYSTEMS:  Positive for speech difficulty, weakness, depression,  unsteadiness on her feet.   Review of systems is negative as per history of  present illness and great than eight other organ systems.   PHYSICAL EXAMINATION:  VITAL SIGNS:  Temperature is 98.2, blood pressure is  159/113, pulse is 74, respirations are 22, oxygen saturation 96% on room  air.  HEENT:  Normocephalic, atraumatic.  Extraocular muscles are intact.  Pupils  are equal.  NECK:  Supple.  No carotid bruits.  HEART:  Regular.  LUNGS:  Clear.  ABDOMEN:  Soft nontender with good bowel sounds.  EXTREMITIES:  Show good pulses.  NEUROLOGIC:  The patient is alert and oriented x3.  Language is dysarthric  and expressive aphasic.  Memory is within normal limits.  Cranial nerves II-  XII are grossly intact.  Motor examination shows 4 to 4+ out of 5 strength  in bilateral upper extremities and right lower extremity with some giveaway  weakness.  The left lower extremity  is 4/5 strength.  Sensory examination  is within normal limits to light touch and pinprick throughout.  Reflexes  are 2 to 3+.  Toes, right toe is  downgoing, left toe is upgoing.  Cerebellar  function:  The patient does have some difficulty with finger-to-nose on the  left.  The patient has good rapid alternating movements.  Gait:  The patient  has a slightly wide based gait and slightly drags her left lower extremity  when walking.   The patient did have an MRI MRA at the outside hospital which showed acute  left pontine infarct, atrophy, and white matter disease.  It also showed a  nasopharyngeal mass, an old left mid brain left thalamus and lenticular  nucleus infarcts.  MRA of the brain showed severe stenosis of the distal  right vertebral artery and a __________  right PCA.   LABORATORY:  WBC is 10.4, hemoglobin is 14.6, hematocrit is 42.5, platelets  320.  PT is 13.0, INR is 1.0.  Sodium is 135, potassium is 3.5, chloride is  105, CO2 is 21, BUN 9, creatinine is 1.2, glucose is 115, calcium is 9.4.  Albumin 4.1.  LFTs are within normal  limits.  Alk phos is 129.  Urine drug  screen was positive for cocaine.  Alcohol was less than 5.  UA showed  positive nitrates and many bacteria with a specific gravity of 1.010.   IMPRESSION:  1.  Ms. Abila is a 55 year old Caucasian female with multiple medical      problems who presents with a left pontine cerebrovascular accident.  Her      symptoms have slightly improved since her onset.  The patient is not a      candidate for any acute intervention as her symptoms started last      evening and she woke up with them this morning.  The patient does have      increased risk factors including positive cocaine which is most likely      the cause for her pontine infarct.  We will admit the patient to the      stroke service and check carotid Doppler studies, a 2D echo, lipids and      homocystine levels.  We will continue the patient on Plavix and baby      aspirin at this time for now.  We will keep her NPO and start      intravenous fluids normal saline at 100 cc/hr.  We will keep her NPO      until she is cleared by speech.  We will put the patient on deep vein      thrombosis prophylaxis for a year.  Positive urine, we will send a urine      culture with sensitivities and start the patient on Levaquin 250 mg per      day for five days.  2.  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  We will consider an ear, nose, and      throat consult in the morning.           ______________________________  Bevelyn Buckles. Nash Shearer, M.D.     DRC/MEDQ  D:  04/01/2005  T:  04/01/2005  Job:  [id number]

## 2010-10-03 NOTE — Discharge Summary (Signed)
NAME:  [name], [name]                           ACCOUNT NO.:  [id number]   MEDICAL RECORD NO.:  [id number]                   PATIENT TYPE:  INP   LOCATION:  2027                                 FACILITY:  [facility]   PHYSICIAN:  Madaline Savage, M.D.             DATE OF BIRTH:  1955-06-19   DATE OF ADMISSION:  03/27/2002  DATE OF DISCHARGE:  03/30/2002                                 DISCHARGE SUMMARY   DISCHARGE DIAGNOSES:  1. Acute myocardial infarction, status post coronary artery angiography with     intervention to the left anterior descending, performed by Dr. Elsie Lincoln on     March 27, 2002.  2. Hypertension.  3. Strong family history of coronary artery disease.  4. Favorable lipid profile.  5. Mild left ventricular dysfunction with ejection fraction 40% by     catheterization.   HISTORY OF PRESENT ILLNESS:  This is a 56 year old Caucasian female who  presented to [facility] with complaint of chest  pain.  Physical exam and lab values at [facility] were positive for  acute myocardial infarction and the patient was transferred to [facility] for intervention.  She was started on heparin IV, IV nitroglycerin,  aspirin, and beta-blockers.   PROCEDURES:  Dr. Elsie Lincoln performed coronary angiography with intervention on  March 27, 2002 with a successful angioplasty and stenting of the proximal  left anterior descending and successful cutting balloon angioplasty with  reduction of the lesion from 75% to less than 0% in the mid left anterior  descending.  The procedure was carried out without no complications.  The  patient tolerated it well and was transferred to the telemetry unit in  stable condition.   LABORATORY DATA:  Her EKG on admission showed elevation of the ST segment in  precordial leads V1 and V3.  There was normal sinus rhythm.   Cardiac panel showed total creatinine kinase 900%, CK-MB 110, troponin  19.29.  A second set  of enzymes showed CK 804, CK-MB 65.9, and troponin  32.54.  The third set of cardiac panel showed CK 733, CK-MB 64.4, and  troponin-I 28.23.  CBC showed a white blood cell count of 11.4, hemoglobin  9.5, hematocrit 28.8, platelets 223.  BMP latest at discharge time showed  sodium 137, potassium 4.2, chloride 108, CO2 24, glucose 117, BUN 12,  creatinine 1.0.   ASSESSMENT:  The patient was assessed by Dr. Elsie Lincoln the morning of her  discharge and was found to be stable from cardiovascular standpoint.  Hemoglobin was moderately decreased at 9.5 and recommendation was to proceed  with iron and vitamins and the office will send the patient Hemoccult cards  to proceed with guaiac stool testing to find out gastrointestinal source of  bleeding.   Samples of medications were provided from the office.   DISCHARGE DIET:  Low-salt/low-fat diet.   DISCHARGE ACTIVITIES:  No driving, no strenuous activity until seen by Dr.  Elsie Lincoln in the office.    DISCHARGE INSTRUCTIONS:  She was allowed to take showers and instructed not  to rub groin site and pat dry.  Report any problems of swelling, redness,  bleeding, or increased pain to our office and the number was provided.   FOLLOW UP:  Follow up with Dr. Elsie Lincoln on December 7 at 11:30 a.m.     Caleb Decock DICTATOR                          Madaline Savage, M.D.    DD/MEDQ  D:  03/30/2002  T:  03/30/2002  Job:  [id number]

## 2010-10-03 NOTE — Discharge Summary (Signed)
[name], [name]                 ACCOUNT NO.:  [id number]   MEDICAL RECORD NO.:  [id number]          PATIENT TYPE:  INP   LOCATION:  A338                          FACILITY:  [facility]   PHYSICIAN:  Calvert Cantor, M.D.     DATE OF BIRTH:  [birth year]   DATE OF ADMISSION:  08/31/2004  DATE OF DISCHARGE:  LH                                 DISCHARGE SUMMARY   DISCHARGE DIAGNOSES:  1.  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  2.  History of stroke.  3.  Coronary artery disease.  4.  Hypertension.   DISCHARGE MEDICATIONS:  1.  Valacyclovir 1000 mg two tablets three times a day for one week.  2.  Plavix 75 mg a day.  3.  Lopressor XL 100 mg a day.   BRIEF HOSPITAL COURSE:  This is a 55 year old white female who is admitted  to the hospital for worsening of herpes zoster infection on her forehead and  around her eye.  It had gotten to the point where her eye was so swollen  that she could barely keep it open.  She had been on acyclovir, however, the  infection was worsening.  The patient was admitted and ophthalmology consult  was placed for possible globe involvement.  She was started on valacyclovir  in the hospital p.o.  The following day, there was minimal improvement.  The  ophthalmologist did see the patient and communicated to me that she had no  ophthalmic involvement.  Today, the patient appears to be much better.  Her  swelling has decreased.  There are a few new vesicles, however, most of them  have encrusted over.  Therefore the patient is going to be discharged on the  above medication.   DISCHARGE INSTRUCTIONS:  1.  Limit touching of the infected area.  2.  Wear gauze patch if possible above the open lesions.  3.  Follow-up with [facility] within a week.      SR/MEDQ  D:  09/02/2004  T:  09/02/2004  Job:  [id number]

## 2010-10-03 NOTE — Discharge Summary (Signed)
NAMEJAMIE-LEE, [name]                 ACCOUNT NO.:  [id number]   MEDICAL RECORD NO.:  [id number]          PATIENT TYPE:  INP   LOCATION:  3041                         FACILITY:  [facility]   PHYSICIAN:  Pramod P. Pearlean Brownie, MD    DATE OF BIRTH:  1955/06/30   DATE OF ADMISSION:  04/01/2005  DATE OF DISCHARGE:                                 DISCHARGE SUMMARY   ADMISSION DIAGNOSIS:  Stroke.   DISCHARGE DIAGNOSIS:  1.  Left paramedian pontine infarction secondary to small vessel disease.  2.  Old left mid brain, thalamus, and internal capsule lacunar infarcts.  3.  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  Neoplasm      less likely.  4.  Hypertension.  5.  Cocaine abuse.   HOSPITAL COURSE:  [name] is a 55 year old Caucasian lady who was admitted  with sudden onset of dysarthria, dysphagia, left leg weakness, and gait  difficulties on the day of admission.  She presented beyond the timing of  her thrombolysis.  On examination she was found to have some mild left leg  weakness.  Her speech was improving by the time she was seen.  A CT scan of  the head showed no acute abnormality, though old lacunar infarcts were  noted.  She was admitted to stroke unit where telemetry monitoring did not  reveal any cardiac arrhythmias.  The MRI scan of the brain subsequently  showed a small left paramedian pontine lacunar infarct.  Old lacunar  infarcts were noted in the left mid brain, thalamus, and internal capsule.  A 2 x 1.5 cm lesion was noted in the nasopharynx which was enhancing.  Remitting characteristics were not typical for a cyst and a question of  infected Thornwald cyst versus malignancy was raised.  Patient was seen on  consultation by Dr. Pollyann Kennedy from ENT who though it more likely looked like  Thornwald cyst but he said he would want to do an outpatient endoscopy to  look at the lesion better but this could be done as an outpatient.  As for  the stroke work-up revealed  elevated cholesterol of 197 with triglycerides  to 228, LDL 118, HDL was low at 33.  Homocysteine was elevated 21.4.  A  hemoglobin A1c was normal.  The patient's UA on admission showed positive  nitrite and bacteria suggestive of a UTI and hence she was treated with  ciprofloxacin for five days and was discontinued at the time of discharge.  Patient also expressed depressive ideation and requested antidepressants and  hence she was started on Wellbutrin SR for depression.  She was started on  Zocor for elevated lipids and Foltx for elevated homocysteine.  Her blood  pressure medications were continued and blood pressure remained stable  throughout the hospitalization.  When initially seen by physical therapist  on April 03, 2005 it was thought that she might need rehabilitation;  however, over the weekend the patient was found to improve and when  reevaluated on Monday, April 06, 2005 patient was found to be ambulating  with a rolling walker and felt that  she could be discharged home.  She  stated her boyfriend would provide support as well as she had a friend who  would help her for appointments.  Patient was discharged home in stable  condition and advised to keep outpatient appointment for physical and  occupational therapy as well as see Dr. Pollyann Kennedy from ENT for endoscopy for the  nasopharyngeal lesion.  She was advised to find herself primary care  physician in the [place] area and was counseled to quit cocaine use and  to stop it.  She also advised to take her medications regularly and maintain  strict control of hypertension and hyperlipidemia.   DISCHARGE MEDICATIONS:  1.  Aggrenox one capsule daily for two weeks, increased as tolerated to      twice a day without headaches.  2.  Zestoretic 20 mg daily.  3.  Wellbutrin SR 150 mg a day.  4.  Zocor 20 mg a day.  5.  Foltx one tablet daily.   She was advised to follow up with Annie Main, nurse practitioner in my  office  in four weeks and with Dr. Pollyann Kennedy, ENT.           ______________________________  Sunny Schlein. Pearlean Brownie, MD     PPS/MEDQ  D:  04/06/2005  T:  04/06/2005  Job:  [id number]   cc:   Jeannett Senior. Pollyann Kennedy, MD  Fax: [phone]

## 2010-12-18 ENCOUNTER — Other Ambulatory Visit: Payer: Self-pay | Admitting: *Deleted

## 2010-12-18 ENCOUNTER — Telehealth: Payer: Self-pay | Admitting: *Deleted

## 2010-12-18 ENCOUNTER — Telehealth: Payer: Self-pay | Admitting: Cardiology

## 2010-12-18 MED ORDER — ATORVASTATIN CALCIUM 80 MG PO TABS: 80.0000 mg | ORAL_TABLET | Freq: Every day | ORAL | Status: DC

## 2010-12-18 NOTE — Telephone Encounter (Signed)
Discussed with pt, was unable to tolerate full dose of lipitor at first but has titrated to 80mg  now

## 2010-12-18 NOTE — Telephone Encounter (Signed)
Patient states that [name] left her message about taking 40mg  of Lasix but her bottle says 80mg  / tg

## 2010-12-18 NOTE — Telephone Encounter (Signed)
Informed pt of need to be taking lipitor 40mg  daily as prescribed by Dr. Diona Browner. Pt should take 2

## 2010-12-19 ENCOUNTER — Telehealth: Payer: Self-pay | Admitting: *Deleted

## 2010-12-19 MED ORDER — ATORVASTATIN CALCIUM 80 MG PO TABS: 80.0000 mg | ORAL_TABLET | Freq: Every day | ORAL | Status: DC

## 2010-12-19 NOTE — Telephone Encounter (Signed)
.   Requested Prescriptions   Signed Prescriptions Disp Refills  . atorvastatin (LIPITOR) 80 MG tablet 90 tablet 3    Sig: Take 1 tablet (80 mg total) by mouth daily.    Authorizing Provider: Joni Reining    Ordering User: Augusto Gamble

## 2011-02-16 ENCOUNTER — Other Ambulatory Visit: Payer: Self-pay | Admitting: Cardiology

## 2011-02-17 LAB — HEPATIC FUNCTION PANEL
ALT: 15 U/L (ref 0–35)
AST: 15 U/L (ref 0–37)
Albumin: 4.3 g/dL (ref 3.5–5.2)

## 2011-02-17 LAB — LIPID PANEL
Cholesterol: 193 mg/dL (ref 0–200)
HDL: 46 mg/dL
LDL Cholesterol: 107 mg/dL — ABNORMAL HIGH (ref 0–99)
Total CHOL/HDL Ratio: 4.2 ratio
Triglycerides: 202 mg/dL — ABNORMAL HIGH
VLDL: 40 mg/dL (ref 0–40)

## 2011-02-25 ENCOUNTER — Telehealth: Payer: Self-pay | Admitting: Cardiology

## 2011-02-25 NOTE — Telephone Encounter (Signed)
Patient's husband states that patient needs letter stating that is okay to have 2 teeth extracted.  Patient has had 2 stents placed previously.  Was scheduled today but they could not do.  Dr.Sandra Gerilyn Pilgrim is performing.  Phone number is [phone].

## 2011-02-26 NOTE — Telephone Encounter (Signed)
Should be able to proceed at an acceptable cardiac risk as noted.

## 2011-02-26 NOTE — Telephone Encounter (Signed)
More information is needed. In general, tooth extraction is a low risk procedure from a cardiac perspective. I presume that the patient is only going to have local anesthesia. She does have a history of previous stent placement to the LAD, however it is known that this stent is occluded, and she has collateral filling to the distal LAD, being managed medically at this point. She does not require dual antiplatelet therapy at this time. While aspirin could be held temporarily if needed, and generally recommend continuing at. Please check with the dentist office to clarify that the patient is going to have tooth extraction under local anesthesia only, and if this is the case, she should be able to proceed.

## 2011-02-26 NOTE — Telephone Encounter (Signed)
**Note De-Identified [name] Obfuscation** Dr. Ronal Fear nurse states that pt. will be under Nitrous gas along with numbing injections for tooth extractions./LV

## 2011-03-04 ENCOUNTER — Encounter: Payer: Self-pay | Admitting: Cardiology

## 2011-03-06 ENCOUNTER — Ambulatory Visit (INDEPENDENT_AMBULATORY_CARE_PROVIDER_SITE_OTHER): Payer: 59 | Admitting: Cardiology

## 2011-03-06 ENCOUNTER — Encounter: Payer: Self-pay | Admitting: Cardiology

## 2011-03-06 VITALS — BP 136/89 | HR 68 | Resp 18 | Ht 61.0 in | Wt 166.0 lb

## 2011-03-06 DIAGNOSIS — I2510 Atherosclerotic heart disease of native coronary artery without angina pectoris: Secondary | ICD-10-CM

## 2011-03-06 DIAGNOSIS — E782 Mixed hyperlipidemia: Secondary | ICD-10-CM

## 2011-03-06 DIAGNOSIS — I10 Essential (primary) hypertension: Secondary | ICD-10-CM

## 2011-03-06 NOTE — Assessment & Plan Note (Signed)
Continue present medications. 

## 2011-03-06 NOTE — Assessment & Plan Note (Signed)
Symptomatically stable on medical therapy. No changes made today. We discussed warning signs. Continue observation.

## 2011-03-06 NOTE — Assessment & Plan Note (Signed)
Plan to stay on current dose of Lipitor. Diet was discussed. Recheck LFT and FLP for next visit.

## 2011-03-06 NOTE — Progress Notes (Signed)
Clinical Summary [name] is a 55 y.o.female presenting for followup. She was seen in April. Reports no progressive angina or dyspnea.  Recent lab work shows cholesterol 193, triglycerides 202, HDL 46, LDL 107 (up from 75). AST 15 and ALT 15. We discussed this today. She reports compliance with her Lipitor. We reviewed diet measures as well.  She is pending dental work under sedation. No cardiac contraindication to proceed.   No Known Allergies  Medication list reviewed.  Past Medical History  Diagnosis Date  . Essential hypertension, benign   . Myocardial infarction   . CVA (cerebral vascular accident)   . Urinary incontinence   . CKD (chronic kidney disease)     Stage 3  . Renal artery stenosis     Bilateral, right stent  . 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 Coronary atherosclerosis of native coronary artery     Occluded LAD (stent SEHV) with collaterals  . Asthmatic bronchitis     Social History [name] reports that she has never smoked. She has never used smokeless tobacco. [name] reports that she does not drink alcohol.  Review of Systems No palpitations or syncope. No bleeding problems. Otherwise negative except as outlined.  Physical Examination Filed Vitals:   03/06/11 0819  BP: 136/89  Pulse: 68  Resp: 18   Chronically ill-appearing woman, in no acute distress.  HEENT: Conjunctiva and lids normal, oropharynx with poor dentition.  Neck: Supple, no loud carotid bruits or elevated jugular venous pressure. No thyromegaly.  Lungs: Clear with coarse breath sounds, nonlabored.  Cardiac: Regular rate and rhythm, no S3 gallop, soft basal systolic murmur.  Abdomen: Soft, nontender, bowel sounds present, no bruits.  Extremities: Trace ankle edema, mild venous stasis, distal pulses one plus.  Skin: Warm and dry.  Musculoskeletal: No kyphosis.  Neuropsychiatric: Alert and oriented x3, affect appropriate.     Problem List and  Plan

## 2011-03-06 NOTE — Patient Instructions (Signed)
**Note De-Identified Earnestine Tuohey Obfuscation** Your physician recommends that you continue on your current medications as directed. Please refer to the Current Medication list given to you today.  Your physician recommends that you return for lab work in: 6 months (just before next office visit)  Your physician recommends that you schedule a follow-up appointment in: 6 months

## 2011-06-03 ENCOUNTER — Ambulatory Visit (INDEPENDENT_AMBULATORY_CARE_PROVIDER_SITE_OTHER): Payer: Medicare Other | Admitting: Family Medicine

## 2011-06-03 ENCOUNTER — Encounter: Payer: Self-pay | Admitting: Family Medicine

## 2011-06-03 VITALS — BP 126/78 | HR 82 | Temp 97.9°F | Resp 18 | Ht 61.5 in | Wt 165.1 lb

## 2011-06-03 DIAGNOSIS — Z1239 Encounter for other screening for malignant neoplasm of breast: Secondary | ICD-10-CM

## 2011-06-03 DIAGNOSIS — Z8679 Personal history of other diseases of the circulatory system: Secondary | ICD-10-CM

## 2011-06-03 DIAGNOSIS — I2510 Atherosclerotic heart disease of native coronary artery without angina pectoris: Secondary | ICD-10-CM

## 2011-06-03 DIAGNOSIS — I10 Essential (primary) hypertension: Secondary | ICD-10-CM

## 2011-06-03 DIAGNOSIS — R7309 Other abnormal glucose: Secondary | ICD-10-CM

## 2011-06-03 DIAGNOSIS — R7303 Prediabetes: Secondary | ICD-10-CM

## 2011-06-03 DIAGNOSIS — N183 Chronic kidney disease, stage 3 unspecified: Secondary | ICD-10-CM

## 2011-06-03 DIAGNOSIS — E785 Hyperlipidemia, unspecified: Secondary | ICD-10-CM

## 2011-06-03 NOTE — Assessment & Plan Note (Signed)
Will monitor renal disease. If she has any decline in her function will send her to nephrology

## 2011-06-03 NOTE — Assessment & Plan Note (Signed)
Reviewed last set of labs from her previous PCP as well as the labs in the chart from her cardiologist. She's currently on Lipitor. Will have repeat labs at next visit with cardiology Discussed importance of diet especially with prediabetic assessment

## 2011-06-03 NOTE — Progress Notes (Signed)
  Subjective:    Patient ID: [name], female    DOB: 05-08-1956, 56 y.o.   MRN: [id number]  HPI Pt here to establish care - Previous PCP Dr. Elana Alm - [facility]- Dr. Diona Browner  CAD- patient with history of myocardial infarction in 2003. She is followed by cardiology.  HTN- blood pressure has been well controlled on Norvasc. Patient also takes aspirin 81 mg  Bronchitis- history of bronchitis per the chart. She had some shortness of breath and PFTs in the past. She was on inhaler in the past however has not been on since the past few years. Her breathing has been fine per report. She quit tobacco approximately 10 years ago  CRI- CJD stage III as listed in the chart. She has a history of renal stenosis with a stent placed. Patient does not know any specifics regarding these diseases. Her recent labs show a creatinine in Oct 2012 of 1.1 BUN 25. She has not seen nephrology  CVA- remote history of CVA with residual left sided weakness, has difficulty with left leg as she has a foot drop and often drags her foot and leg   Colonoscopy- pt contemplating, has not had had PAP Smear- overdue Mammogram- overdue       Review of Systems    GEN- denies fatigue, fever, weight loss,weakness, recent illness HEENT- denies eye drainage, change in vision, nasal discharge, CVS- denies chest pain, palpitations RESP- denies SOB, cough, wheeze ABD- denies N/V, change in stools, abd pain GU- denies dysuria, hematuria, dribbling,+ incontinence MSK- denies joint pain, muscle aches, injury Neuro- denies headache, dizziness, syncope, seizure activity      Objective:   Physical Exam GEN- NAD, alert and oriented x3 HEENT- PERRL, EOMI, non injected sclera, pink conjunctiva, MMM, oropharynx clear, fundoscopic exam benign Neck- Supple, no thryomegaly, no carotid bruit CVS- RRR, no murmur RESP-CTAB ABD- NABS, soft, NT,ND EXT- pedal edema Pulses- Radial, DP- 2+ Neuro-  decreased strength in upper and lower ext on left side, gait instability- walks with walker   CNII-XII grossly in tact, speech normal       Assessment & Plan:

## 2011-06-03 NOTE — Assessment & Plan Note (Signed)
Review last cardiology notes. Continue to maximize therapy for her risk factors

## 2011-06-03 NOTE — Assessment & Plan Note (Signed)
A1c was 6.1% in October 2012. Discussed importance of diet. We'll continue to follow her A1c. No medications needed at this time

## 2011-06-03 NOTE — Patient Instructions (Signed)
Schedule a physical exam within the next 4-6 weeks for PAP Smear You will be sent for a Mammogram at that visit  Watch the sweets you are in the pre-diabetic range, your A1C was 6.1%  Continue your other medications Please think about the colonoscopy. If you decide to get a flu shot you can get it at [facility] It was very nice to meet you!

## 2011-06-03 NOTE — Assessment & Plan Note (Signed)
Controlled  - continue amlodipine

## 2011-06-22 ENCOUNTER — Telehealth: Payer: Self-pay | Admitting: Family Medicine

## 2011-06-29 ENCOUNTER — Ambulatory Visit (HOSPITAL_COMMUNITY)
Admission: RE | Admit: 2011-06-29 | Discharge: 2011-06-29 | Disposition: A | Discharge status: Home / Self Care | Payer: Medicare Other | Source: Ambulatory Visit | Attending: Family Medicine | Admitting: Family Medicine

## 2011-06-29 DIAGNOSIS — Z1231 Encounter for screening mammogram for malignant neoplasm of breast: Secondary | ICD-10-CM | POA: Insufficient documentation

## 2011-06-29 DIAGNOSIS — Z1239 Encounter for other screening for malignant neoplasm of breast: Secondary | ICD-10-CM

## 2011-07-08 ENCOUNTER — Other Ambulatory Visit (HOSPITAL_COMMUNITY)
Admission: RE | Admit: 2011-07-08 | Discharge: 2011-07-08 | Disposition: A | Discharge status: Home / Self Care | Payer: Medicare Other | Source: Ambulatory Visit | Attending: Family Medicine | Admitting: Family Medicine

## 2011-07-08 ENCOUNTER — Ambulatory Visit (INDEPENDENT_AMBULATORY_CARE_PROVIDER_SITE_OTHER): Payer: Medicare Other | Admitting: Family Medicine

## 2011-07-08 ENCOUNTER — Encounter: Payer: Self-pay | Admitting: Family Medicine

## 2011-07-08 VITALS — BP 130/82 | HR 80 | Resp 16 | Ht 61.5 in | Wt 165.0 lb

## 2011-07-08 DIAGNOSIS — Z23 Encounter for immunization: Secondary | ICD-10-CM

## 2011-07-08 DIAGNOSIS — Z01419 Encounter for gynecological examination (general) (routine) without abnormal findings: Secondary | ICD-10-CM

## 2011-07-08 DIAGNOSIS — Z124 Encounter for screening for malignant neoplasm of cervix: Secondary | ICD-10-CM

## 2011-07-08 DIAGNOSIS — Z1211 Encounter for screening for malignant neoplasm of colon: Secondary | ICD-10-CM

## 2011-07-08 NOTE — Progress Notes (Signed)
  Subjective:    Patient ID: [name], female    DOB: Jul 15, 1955, 56 y.o.   MRN: [id number]  HPI  Pt here for GYN exam/PAP Smear Mammogram reviewed - normal repeat in 1year No concerns today    Review of Systems    Denies vaginal discharge, abnormal bleeding    Objective:   Physical Exam GEN- NAD, alert and oriented, Breast- deferred-Mammogram last week GU- normal external genitalia, vaginal mucosa pink and moist, cervix visualized no growth, no blood form os, minimal thin clear discharge, no CMT, no ovarian masses, uterus normal size Rectal- small skin tag, no blood in vault, FOBT negative        Assessment & Plan:   GYN exam- PAP Smear done, will follow up cytology.  Mammogram normal   Discussed colon cancer screening again- pt declines  TDAP given

## 2011-07-08 NOTE — Patient Instructions (Signed)
We will send a letter with your PAP Smear results Continue your current medications F/U in  4 months

## 2011-07-10 NOTE — Telephone Encounter (Signed)
Pt aware.

## 2011-07-12 ENCOUNTER — Telehealth: Payer: Self-pay | Admitting: Family Medicine

## 2011-07-12 ENCOUNTER — Encounter (HOSPITAL_COMMUNITY): Payer: Self-pay | Admitting: Emergency Medicine

## 2011-07-12 ENCOUNTER — Emergency Department (HOSPITAL_COMMUNITY)
Admission: EM | Admit: 2011-07-12 | Discharge: 2011-07-13 | Disposition: A | Discharge status: Home / Self Care | Payer: Medicare Other | Attending: Emergency Medicine | Admitting: Emergency Medicine

## 2011-07-12 ENCOUNTER — Emergency Department (HOSPITAL_COMMUNITY): Payer: Medicare Other

## 2011-07-12 DIAGNOSIS — I129 Hypertensive chronic kidney disease with stage 1 through stage 4 chronic kidney disease, or unspecified chronic kidney disease: Secondary | ICD-10-CM | POA: Insufficient documentation

## 2011-07-12 DIAGNOSIS — N183 Chronic kidney disease, stage 3 unspecified: Secondary | ICD-10-CM | POA: Insufficient documentation

## 2011-07-12 DIAGNOSIS — R29898 Other symptoms and signs involving the musculoskeletal system: Secondary | ICD-10-CM | POA: Insufficient documentation

## 2011-07-12 DIAGNOSIS — Z8673 Personal history of transient ischemic attack (TIA), and cerebral infarction without residual deficits: Secondary | ICD-10-CM | POA: Insufficient documentation

## 2011-07-12 DIAGNOSIS — R200 Anesthesia of skin: Secondary | ICD-10-CM

## 2011-07-12 DIAGNOSIS — I252 Old myocardial infarction: Secondary | ICD-10-CM | POA: Insufficient documentation

## 2011-07-12 DIAGNOSIS — Z79899 Other long term (current) drug therapy: Secondary | ICD-10-CM | POA: Insufficient documentation

## 2011-07-12 DIAGNOSIS — I2510 Atherosclerotic heart disease of native coronary artery without angina pectoris: Secondary | ICD-10-CM | POA: Insufficient documentation

## 2011-07-12 DIAGNOSIS — R209 Unspecified disturbances of skin sensation: Secondary | ICD-10-CM | POA: Insufficient documentation

## 2011-07-12 LAB — CBC
Hemoglobin: 12.3 g/dL (ref 12.0–15.0)
MCH: 28.1 pg (ref 26.0–34.0)
MCV: 84.7 fL (ref 78.0–100.0)
RBC: 4.38 MIL/uL (ref 3.87–5.11)

## 2011-07-12 LAB — COMPREHENSIVE METABOLIC PANEL
Alkaline Phosphatase: 189 U/L — ABNORMAL HIGH (ref 39–117)
BUN: 20 mg/dL (ref 6–23)
Calcium: 9.3 mg/dL (ref 8.4–10.5)
GFR calc Af Amer: 60 mL/min — ABNORMAL LOW (ref >90)
Glucose, Bld: 109 mg/dL — ABNORMAL HIGH (ref 70–99)
Total Protein: 7.1 g/dL (ref 6.0–8.3)

## 2011-07-12 LAB — TROPONIN I: Troponin I: <0.3 ng/mL (ref <0.30)

## 2011-07-12 LAB — CK TOTAL AND CKMB (NOT AT ARMC)
Relative Index: INVALID (ref 0.0–2.5)
Total CK: 66 U/L (ref 7–177)

## 2011-07-12 LAB — DIFFERENTIAL
Eosinophils Absolute: 0.3 10*3/uL (ref 0.0–0.7)
Eosinophils Relative: 3 % (ref 0–5)
Lymphs Abs: 2.3 10*3/uL (ref 0.7–4.0)
Monocytes Relative: 9 % (ref 3–12)

## 2011-07-12 NOTE — ED Notes (Signed)
Pt presents with left arm numbness and weakness since approx 2100. Pt has chronic weakness to left side from previous CVA. Smile, grips, pushes, pulls all WNL. NAD at this time. Pt placed on cardiac monitor. Husband at bedside. Will continue to monitor.

## 2011-07-12 NOTE — ED Notes (Signed)
Patient states her left arm started going numb about two hours ago. History of MI, stent placement, and stroke.

## 2011-07-12 NOTE — ED Provider Notes (Addendum)
This chart was scribed for Kristen Roller, MD by [name]. The patient was seen in room [facility]12/[facility]12 at 10:55 PM.  CSN: [id number]  Arrival date & time 07/12/11  2157   None     Chief Complaint  Patient presents with  . Numbness    (Consider location/radiation/quality/duration/timing/severity/associated sxs/prior treatment) HPI Comments: 56 year old female the history of kidney disease, stroke 10 years ago and hypertension. She presents to the ER with a complaint of left arm numbness. This was acute in onset approximately 3 hours prior to arrival, is gradually improving and is not associated with weakness of the arm or leg on either side. She denies changes in her vision, difficulty with balance(beyond normal difficulty) or any changes in speech. Symptoms are mild, persistent, she denies fevers chills nausea vomiting abdominal pain back pain rash swelling headache or chest pain.  The history is provided by the patient and a relative.   [name] is a 56 y.o. female who presents to the Emergency Department complaining of constant acute onset left arm weakness for about 3 hours. Pt is on aspirin. BP was at 140/110 pta. History of stroke with weakness on left side  Past Medical History  Diagnosis Date  . Essential hypertension, benign   . Myocardial infarction   . CVA (cerebral vascular accident)   . Urinary incontinence   . CKD (chronic kidney disease)     Stage 3  . Renal artery stenosis     Bilateral, right stent  . 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 Coronary atherosclerosis of native coronary artery     Occluded LAD (stent [facility]) with collaterals  . Asthmatic bronchitis   . Shingles     Face    Past Surgical History  Procedure Date  . Tubal ligation   . Coronary angioplasty with stent placement     Family History  Problem Relation Age of Onset  . Heart attack Mother   . Heart disease Mother   . Diabetes Mother   . Heart attack Father    . Heart disease Father   . Heart disease Brother   . Coronary artery disease Other   . Diabetes Sister     History  Substance Use Topics  . Smoking status: Never Smoker   . Smokeless tobacco: Never Used  . Alcohol Use: No    OB History    Grav Para Term Preterm Abortions TAB SAB Ect Mult Living                  Review of Systems  All other systems reviewed and are negative.   10 Systems reviewed and are negative for acute change except as noted in the HPI.  Allergies  Review of patient's allergies indicates no known allergies.  Home Medications   Current Outpatient Rx  Name Route Sig Dispense Refill  . AMLODIPINE BESYLATE 10 MG PO TABS Oral Take 10 mg by mouth daily.      . ATORVASTATIN CALCIUM 40 MG PO TABS Oral Take 40 mg by mouth daily.      Marland Kitchen FISH OIL 1000 MG PO CAPS Oral Take 1 capsule by mouth daily.    Marland Kitchen OMEPRAZOLE 20 MG PO CPDR Oral Take 20 mg by mouth daily.      . ASPIRIN-DIPYRIDAMOLE 25-200 MG PO CP12 Oral Take 1 capsule by mouth 2 (two) times daily. 30 capsule 1    BP 120/76  Pulse 73  Temp(Src) 98.1  F (36.7 C) (Oral)  Resp 20  Ht 5\' 1"  (1.549 m)  Wt 150 lb (68.04 kg)  BMI 28.34 kg/m2  SpO2 96%  Physical Exam  Nursing note and vitals reviewed. Constitutional: She is oriented to person, place, and time. She appears well-developed and well-nourished.  HENT:  Head: Normocephalic and atraumatic.  Eyes: Conjunctivae are normal. Pupils are equal, round, and reactive to light.  Neck: Normal range of motion. Neck supple.  Cardiovascular: Normal rate, regular rhythm and normal heart sounds.   Pulmonary/Chest: Effort normal and breath sounds normal.  Musculoskeletal: Normal range of motion. She exhibits no edema and no tenderness.  Neurological: She is alert and oriented to person, place, and time. No cranial nerve deficit.       Noticeable weakness left leg weaker than right though this is barely noticeable  Neurologic exam:  Speech clear, pupils  equal round reactive to light, extraocular movements intact  Normal peripheral visual fields Cranial nerves III through XII normal including no facial droop Follows commands, moves all extremities x4, normal strength to bilateral upper and lower extremities at all major muscle groups including grip - except for the slight weakness of the left lower extremity Sensation normal to light touch and pinprick of the bilateral upper and lower extremities Coordination intact, no limb ataxia, finger-nose-finger normal Rapid alternating movements normal No pronator drift Gait normal   Skin: Skin is warm and dry.  Psychiatric: She has a normal mood and affect. Her behavior is normal.    ED Course  Procedures (including critical care time) DIAGNOSTIC STUDIES: Oxygen Saturation is 96% on room air, normal by my interpretation.    COORDINATION OF CARE:  Medications  Omega-3 Fatty Acids (FISH OIL) 1000 MG CAPS (not administered)  dipyridamole-aspirin (AGGRENOX) 25-200 MG per 12 hr capsule (not administered)      Labs Reviewed  COMPREHENSIVE METABOLIC PANEL - Abnormal; Notable for the following:    Glucose, Bld 109 (*)    Creatinine, Ser 1.15 (*)    Albumin 3.3 (*)    Alkaline Phosphatase 189 (*)    Total Bilirubin 0.2 (*)    GFR calc non Af Amer 52 (*)    GFR calc Af Amer 60 (*)    All other components within normal limits  PROTIME-INR  APTT  CBC  DIFFERENTIAL  CK TOTAL AND CKMB  TROPONIN I   Ct Head Wo Contrast  07/13/2011  *RADIOLOGY REPORT*  Clinical Data: Left arm weakness and numbness.  CT HEAD WITHOUT CONTRAST  Technique:  Contiguous axial images were obtained from the base of the skull through the vertex without contrast.  Comparison: CT of the head, and MRI/MRA of the brain, performed 05/01/2005  Findings: There is no evidence of acute infarction, mass lesion, or intra- or extra-axial hemorrhage on CT.  Prominence of the ventricles and sulci reflects mild cortical volume loss.   There is unusual prominence of periventricular and subcortical white matter change; this appears grossly stable from 2006.  Would correlate for symptoms of demyelinating disease; on comparison with the prior MRI, this may simply reflect extensive small vessel ischemic microangiopathy.  A small chronic lacunar infarct is noted within the left thalamus. Cerebellar atrophy is noted.  The brainstem and fourth ventricle are within normal limits.  The cerebral hemispheres demonstrate grossly normal gray-white differentiation.  No mass effect or midline shift is seen.  There is no evidence of fracture; visualized osseous structures are unremarkable in appearance.  The orbits are within normal limits. The  paranasal sinuses and mastoid air cells are well-aerated.  No significant soft tissue abnormalities are seen.  IMPRESSION:  1.  No acute intracranial pathology seen on CT. 2.  Mild cortical volume loss noted. 3.  Unusual prominence of periventricular and subcortical white matter change; this appears grossly stable from 2006.  Would correlate for symptoms of demyelinating disease; on comparison with the prior MRI, this may simply reflect extensive small vessel ischemic microangiopathy. 4.  Small chronic lacunar infarct within the left thalamus.  Original Report Authenticated By: Tonia Ghent, M.D.     1. Numbness       MDM  Exam is consistent with prior stroke but no apparent new stroke. She has no focal objective numbness to the arm on the left. The rest of her neurologic exam is at baseline. Vital signs at this time appear normal with a blood pressure of 120/66, pulse of 73 oxygen saturations normal, respirations normal, afebrile. She has no chest pain and an EKG that shows no significant findings.  I've reviewed the CT scan which shows no signs of acute infarct and no changes since 2006, lab work has also been reviewed and shows renal insufficiency which is minimal, normal electrolytes, normal blood counts,  normal troponin.  ED ECG REPORT   Date: 07/13/2011   Rate: 61  Rhythm: normal sinus rhythm  QRS Axis: normal  Intervals: normal  ST/T Wave abnormalities: nonspecific T wave changes  Conduction Disutrbances:none  Narrative Interpretation:   Old EKG Reviewed: Compared with EKG from 03/28/2002, septal infarct criteria and left ventricular hypertrophy criteria have not resolved, nonspecific T-wave abnormality present in the lateral aVL, otherwise no other significant changes  Chest at length the results with the patient and encourage her to followup closely with her doctor for further TIA workup. Over the course of her visit her symptoms completely resolved and she has no symptoms at this time. I will start her on Aggrenox and have her stop aspirin at this time.   Kristen Roller, MD 07/13/11 0136  I personally performed the services described in this documentation, which was scribed in my presence. The recorded information has been reviewed and considered.    Kristen Roller, MD 07/13/11 [phone]

## 2011-07-12 NOTE — Telephone Encounter (Signed)
Pt called blood pressure elevated 140/110's, she has numbness in left arm/hand for the past hour but no chest pain but is worried History of MI/CAD. Sent to [facility] for evaluation.

## 2011-07-13 ENCOUNTER — Other Ambulatory Visit: Payer: Self-pay

## 2011-07-13 MED ORDER — ASPIRIN-DIPYRIDAMOLE ER 25-200 MG PO CP12: 1.0000 | ORAL_CAPSULE | Freq: Two times a day (BID) | ORAL | Status: DC

## 2011-07-13 NOTE — ED Notes (Signed)
Rx given x1 D/c instructions reviewed w/ pt - pt denies any further questions or concerns at present.   

## 2011-07-21 ENCOUNTER — Telehealth: Payer: Self-pay | Admitting: Family Medicine

## 2011-07-21 MED ORDER — OMEPRAZOLE 20 MG PO CPDR: 20.0000 mg | DELAYED_RELEASE_CAPSULE | Freq: Every day | ORAL | Status: DC

## 2011-07-21 NOTE — Telephone Encounter (Signed)
Refilled as requested  

## 2011-07-28 ENCOUNTER — Encounter: Payer: Self-pay | Admitting: Family Medicine

## 2011-07-28 ENCOUNTER — Ambulatory Visit (INDEPENDENT_AMBULATORY_CARE_PROVIDER_SITE_OTHER): Payer: Medicare Other | Admitting: Family Medicine

## 2011-07-28 DIAGNOSIS — J45909 Unspecified asthma, uncomplicated: Secondary | ICD-10-CM

## 2011-07-28 DIAGNOSIS — J069 Acute upper respiratory infection, unspecified: Secondary | ICD-10-CM

## 2011-07-28 MED ORDER — ALBUTEROL SULFATE HFA 108 (90 BASE) MCG/ACT IN AERS: 2.0000 | INHALATION_SPRAY | RESPIRATORY_TRACT | Status: DC | PRN

## 2011-07-28 MED ORDER — AZITHROMYCIN 250 MG PO TABS: ORAL_TABLET | ORAL | Status: AC

## 2011-07-28 MED ORDER — DEXTROMETHORPHAN-GUAIFENESIN 10-100 MG/5ML PO LIQD: 5.0000 mL | ORAL | Status: AC | PRN

## 2011-07-28 NOTE — Patient Instructions (Signed)
I am treating for asthma bronchitis Take the antibiotics as prescribed Use the inhaler every 4 hours if needed and take it at bedtime to help the cough and wheeze If you do not improve by Friday please call  Use the cough medicine

## 2011-07-30 DIAGNOSIS — J45909 Unspecified asthma, uncomplicated: Secondary | ICD-10-CM | POA: Insufficient documentation

## 2011-07-30 DIAGNOSIS — J069 Acute upper respiratory infection, unspecified: Secondary | ICD-10-CM | POA: Insufficient documentation

## 2011-07-30 NOTE — Progress Notes (Signed)
  Subjective:    Patient ID: [name], female    DOB: 10-22-1955, 56 y.o.   MRN: [id number]  HPI   Fever and cough with production for the past 3-4 days. She's also been wheezing at night time. Occasional shortness of breath. No chest pain. Denies nausea, vomiting. Positive sick contact with her husband. Non smoker   Review of Systems  GEN- +fatigue, +fever, weight loss,weakness, recent illness HEENT- denies eye drainage, change in vision, nasal discharge, CVS- denies chest pain, palpitations RESP- + SOB, cough, wheeze ABD- denies N/V, change in stools, abd pain MSK- denies  muscle aches, Neuro- denies headache, dizziness, syncope, seizure activity Skin- no rash      Objective:   Physical Exam GEN- NAD, alert and oriented x3, +fever, ill appearing HEENT- PERRL, EOMI, non injected sclera, pink conjunctiva, MMM, oropharynx clear, TM clear bilat, +maxillary sinus pressure Neck- Supple, no LAD CVS- RRR, no murmur RESP- decreased BS at bases, no wheeze, mild rhonchi, upper airway congestion EXT- No edema Pulses- Radial, DP- 2+        Assessment & Plan:

## 2011-07-30 NOTE — Assessment & Plan Note (Signed)
She does not appear acutely decompensated, no hypoxia, will given inhaler

## 2011-07-30 NOTE — Assessment & Plan Note (Signed)
Will  treat with for upper respiratory infection cover and bronchitis this patient has asthma. She will also use of urology inhaler. Zpak given

## 2011-08-21 ENCOUNTER — Other Ambulatory Visit: Payer: Self-pay | Admitting: Cardiology

## 2011-08-22 LAB — HEPATIC FUNCTION PANEL
AST: 20 U/L (ref 0–37)
Bilirubin, Direct: 0.1 mg/dL (ref 0.0–0.3)
Total Bilirubin: 0.3 mg/dL (ref 0.3–1.2)

## 2011-08-22 LAB — LIPID PANEL
LDL Cholesterol: 67 mg/dL (ref 0–99)
Total CHOL/HDL Ratio: 3.3 Ratio

## 2011-09-11 ENCOUNTER — Ambulatory Visit (INDEPENDENT_AMBULATORY_CARE_PROVIDER_SITE_OTHER): Payer: Medicare Other | Admitting: Cardiology

## 2011-09-11 ENCOUNTER — Encounter: Payer: Self-pay | Admitting: Cardiology

## 2011-09-11 VITALS — BP 130/82 | HR 69 | Resp 16 | Ht 61.0 in | Wt 163.0 lb

## 2011-09-11 DIAGNOSIS — E785 Hyperlipidemia, unspecified: Secondary | ICD-10-CM

## 2011-09-11 DIAGNOSIS — I10 Essential (primary) hypertension: Secondary | ICD-10-CM

## 2011-09-11 DIAGNOSIS — I2510 Atherosclerotic heart disease of native coronary artery without angina pectoris: Secondary | ICD-10-CM

## 2011-09-11 NOTE — Patient Instructions (Signed)
**Note De-Identified Maverick Dieudonne Obfuscation** Your physician has recommended you make the following change in your medication: start taking Aspirin 81 mg daily  Your physician recommends that you return for lab work in: just before next visit in 6 month  Your physician recommends that you schedule a follow-up appointment in: 6 months

## 2011-09-11 NOTE — Assessment & Plan Note (Signed)
No changes to current regimen. Sodium restriction reinforced.

## 2011-09-11 NOTE — Assessment & Plan Note (Signed)
Continue current regimen - recheck FLP and LFT for next visit.

## 2011-09-11 NOTE — Assessment & Plan Note (Signed)
Recent creatinine improved to 1.1 in February.

## 2011-09-11 NOTE — Assessment & Plan Note (Signed)
Symptomatically stable on medical therapy. Recent ECG reviewed. Add regular ASA 81 mg daily to current regimen - we discussed today. Activity as tolerated. Followup arranged.

## 2011-09-11 NOTE — Progress Notes (Signed)
   Clinical Summary Ms. Chason is a 56 y.o.female presenting for followup. She was seen in October 2012. She reports no chest pain. Still using a walker for ambulation, no falls.  ECG from encounter in February showed sinus rhythm with septal Q waves, no acute ST changes.  Recent lab work reviewed showing cholesterol 160, triglycerides 227, HDL 48, LDL 67, AST 20, ALT 15. We discussed these results today and her diet. She reports compliance with her medications.   No Known Allergies  Current Outpatient Prescriptions  Medication Sig Dispense Refill  . albuterol (PROVENTIL HFA;VENTOLIN HFA) 108 (90 BASE) MCG/ACT inhaler Inhale 2 puffs into the lungs every 4 (four) hours as needed for wheezing.  1 Inhaler  0  . amLODipine (NORVASC) 10 MG tablet Take 10 mg by mouth daily.        Marland Kitchen atorvastatin (LIPITOR) 40 MG tablet Take 40 mg by mouth daily.        . Omega-3 Fatty Acids (FISH OIL) 1000 MG CAPS Take 1 capsule by mouth daily.      Marland Kitchen omeprazole (PRILOSEC) 20 MG capsule Take 1 capsule (20 mg total) by mouth daily.  30 capsule  3    Past Medical History  Diagnosis Date  . Essential hypertension, benign   . Myocardial infarction   . CVA (cerebral vascular accident)   . Urinary incontinence   . CKD (chronic kidney disease)     Stage 3  . Renal artery stenosis     Bilateral, right stent  . 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 Coronary atherosclerosis of native coronary artery     Occluded LAD (stent [facility]) with collaterals  . Asthmatic bronchitis   . Shingles     Face    Social History Ms. Zuccaro reports that she has never smoked. She has never used smokeless tobacco. Ms. Brownley reports that she does not drink alcohol.  Review of Systems Appetite stable, no bleeding problems, no palpitations. Otherwise negative.  Physical Examination Filed Vitals:   09/11/11 0930  BP: 130/82  Pulse: 69  Resp: 16   Chronically ill-appearing woman, in no acute distress.    HEENT: Conjunctiva and lids normal, oropharynx with poor dentition.  Neck: Supple, no loud carotid bruits or elevated jugular venous pressure. No thyromegaly.  Lungs: Clear with coarse breath sounds, nonlabored.  Cardiac: Regular rate and rhythm, no S3 gallop, 1/6 basal systolic murmur.  Abdomen: Soft, nontender, bowel sounds present, no bruits.  Extremities: Trace ankle edema, mild venous stasis, distal pulses one plus.  Skin: Warm and dry.  Musculoskeletal: No kyphosis.  Neuropsychiatric: Alert and oriented x3, affect appropriate.    Problem List and Plan

## 2011-11-05 ENCOUNTER — Ambulatory Visit (INDEPENDENT_AMBULATORY_CARE_PROVIDER_SITE_OTHER): Payer: Medicare Other | Admitting: Family Medicine

## 2011-11-05 ENCOUNTER — Encounter: Payer: Self-pay | Admitting: Family Medicine

## 2011-11-05 ENCOUNTER — Encounter (INDEPENDENT_AMBULATORY_CARE_PROVIDER_SITE_OTHER): Payer: Self-pay | Admitting: *Deleted

## 2011-11-05 VITALS — BP 130/80 | HR 68 | Resp 18 | Ht 61.5 in | Wt 165.0 lb

## 2011-11-05 DIAGNOSIS — R7303 Prediabetes: Secondary | ICD-10-CM

## 2011-11-05 DIAGNOSIS — Z1211 Encounter for screening for malignant neoplasm of colon: Secondary | ICD-10-CM

## 2011-11-05 DIAGNOSIS — R32 Unspecified urinary incontinence: Secondary | ICD-10-CM

## 2011-11-05 DIAGNOSIS — J45909 Unspecified asthma, uncomplicated: Secondary | ICD-10-CM

## 2011-11-05 DIAGNOSIS — E669 Obesity, unspecified: Secondary | ICD-10-CM

## 2011-11-05 DIAGNOSIS — E785 Hyperlipidemia, unspecified: Secondary | ICD-10-CM

## 2011-11-05 DIAGNOSIS — I10 Essential (primary) hypertension: Secondary | ICD-10-CM

## 2011-11-05 DIAGNOSIS — N183 Chronic kidney disease, stage 3 unspecified: Secondary | ICD-10-CM

## 2011-11-05 DIAGNOSIS — R7309 Other abnormal glucose: Secondary | ICD-10-CM

## 2011-11-05 MED ORDER — OMEPRAZOLE 20 MG PO CPDR: 20.0000 mg | DELAYED_RELEASE_CAPSULE | Freq: Every day | ORAL | Status: DC

## 2011-11-05 NOTE — Assessment & Plan Note (Signed)
unchanged

## 2011-11-05 NOTE — Assessment & Plan Note (Signed)
Plan to recheck A1C with next set of labs

## 2011-11-05 NOTE — Assessment & Plan Note (Signed)
Elevated TG otherwise lipid panel looks good

## 2011-11-05 NOTE — Progress Notes (Signed)
  Subjective:    Patient ID: [name], female    DOB: [birth year], 56 y.o.   MRN: [id number]  HPI Patient here for chronic medical followup. She has no concerns today. Medications and history were reviewed. She was recently seen by cardiology no changes to regimen needed. I reviewed note. She would like to be referred for colonoscopy.   Review of Systems   GEN- denies fatigue, fever, weight loss,weakness, recent illness HEENT- denies eye drainage, change in vision, nasal discharge, CVS- denies chest pain, palpitations RESP- denies SOB, cough, wheeze ABD- denies N/V, change in stools, abd pain GU- denies dysuria, hematuria, dribbling,+ incontinence MSK- denies joint pain, muscle aches, injury Neuro- denies headache, dizziness, syncope, seizure activity      Objective:   Physical Exam GEN- NAD, alert and oriented x3 HEENT- PERRL, EOMI, non injected sclera, pink conjunctiva, MMM, oropharynx clear, Neck- Supple, CVS- RRR, no murmur RESP-CTAB ABD- NABS, soft, NT,ND EXT-trace pedal edema Pulses- Radial, DP- 2+ Neuro-  gait instability- walks with walker         Assessment & Plan:

## 2011-11-05 NOTE — Assessment & Plan Note (Signed)
Well controlled 

## 2011-11-05 NOTE — Assessment & Plan Note (Signed)
She has been maintaining her renal function, to be checked by cardiology at next visit

## 2011-11-05 NOTE — Assessment & Plan Note (Signed)
Doing well, no concerns no use of meds

## 2011-11-05 NOTE — Patient Instructions (Addendum)
Continue your current medications Keep up the good work Elevate your legs F/U 4 months

## 2012-02-26 ENCOUNTER — Other Ambulatory Visit: Payer: Self-pay | Admitting: *Deleted

## 2012-02-26 DIAGNOSIS — E785 Hyperlipidemia, unspecified: Secondary | ICD-10-CM

## 2012-03-03 LAB — HEPATIC FUNCTION PANEL
ALT: 15 U/L (ref 0–35)
Albumin: 3.9 g/dL (ref 3.5–5.2)
Bilirubin, Direct: 0.1 mg/dL (ref 0.0–0.3)
Total Protein: 7.1 g/dL (ref 6.0–8.3)

## 2012-03-03 LAB — LIPID PANEL
Cholesterol: 172 mg/dL (ref 0–200)
HDL: 46 mg/dL (ref >39)
Total CHOL/HDL Ratio: 3.7 Ratio
VLDL: 27 mg/dL (ref 0–40)

## 2012-03-07 ENCOUNTER — Ambulatory Visit (INDEPENDENT_AMBULATORY_CARE_PROVIDER_SITE_OTHER): Payer: Medicare Other | Admitting: Family Medicine

## 2012-03-07 ENCOUNTER — Encounter: Payer: Self-pay | Admitting: Family Medicine

## 2012-03-07 VITALS — BP 132/80 | HR 90 | Resp 18 | Ht 61.5 in | Wt 163.1 lb

## 2012-03-07 DIAGNOSIS — E785 Hyperlipidemia, unspecified: Secondary | ICD-10-CM

## 2012-03-07 DIAGNOSIS — Z1211 Encounter for screening for malignant neoplasm of colon: Secondary | ICD-10-CM

## 2012-03-07 DIAGNOSIS — I10 Essential (primary) hypertension: Secondary | ICD-10-CM

## 2012-03-07 DIAGNOSIS — I2510 Atherosclerotic heart disease of native coronary artery without angina pectoris: Secondary | ICD-10-CM

## 2012-03-07 DIAGNOSIS — R7309 Other abnormal glucose: Secondary | ICD-10-CM

## 2012-03-07 DIAGNOSIS — N183 Chronic kidney disease, stage 3 unspecified: Secondary | ICD-10-CM

## 2012-03-07 DIAGNOSIS — R7303 Prediabetes: Secondary | ICD-10-CM

## 2012-03-07 DIAGNOSIS — J45909 Unspecified asthma, uncomplicated: Secondary | ICD-10-CM

## 2012-03-07 LAB — HEMOGLOBIN A1C
Hgb A1c MFr Bld: 6.1 % — ABNORMAL HIGH (ref <5.7)
Mean Plasma Glucose: 128 mg/dL — ABNORMAL HIGH (ref <117)

## 2012-03-07 LAB — BASIC METABOLIC PANEL
BUN: 21 mg/dL (ref 6–23)
Calcium: 10 mg/dL (ref 8.4–10.5)
Chloride: 107 mEq/L (ref 96–112)
Creat: 1.06 mg/dL (ref 0.50–1.10)

## 2012-03-07 LAB — ESTIMATED GFR
GFR, Est African American: 67 mL/min
GFR, Est Non African American: 58 mL/min — ABNORMAL LOW

## 2012-03-07 MED ORDER — ATORVASTATIN CALCIUM 40 MG PO TABS: 40.0000 mg | ORAL_TABLET | Freq: Every day | ORAL | Status: DC

## 2012-03-07 MED ORDER — AMLODIPINE BESYLATE 10 MG PO TABS: 10.0000 mg | ORAL_TABLET | Freq: Every day | ORAL | Status: DC

## 2012-03-07 MED ORDER — OMEPRAZOLE 20 MG PO CPDR: 20.0000 mg | DELAYED_RELEASE_CAPSULE | Freq: Every day | ORAL | Status: DC

## 2012-03-07 NOTE — Assessment & Plan Note (Signed)
Check A1C, no meds currently

## 2012-03-07 NOTE — Assessment & Plan Note (Signed)
BP looks good today, no change to meds 

## 2012-03-07 NOTE — Assessment & Plan Note (Signed)
TG much improved, LDL 99

## 2012-03-07 NOTE — Assessment & Plan Note (Signed)
Obtain BMET and EGFR

## 2012-03-07 NOTE — Assessment & Plan Note (Signed)
Has not required inhaler use, declined flu shot

## 2012-03-07 NOTE — Progress Notes (Signed)
  Subjective:    Patient ID: Orion Crook, female    DOB: 15-Feb-1956, 56 y.o.   MRN: [id number]  HPI Patient here to follow chronic medical problems. She has no specific concerns. Doing well. She is to see her heart doctor this week had her fasting lipid panel drawn. Due for labs for renal function and A1c for prediabetes history. Her breathing has been good. Declines flu shot   Review of Systems  GEN- denies fatigue, fever, weight loss,weakness, recent illness HEENT- denies eye drainage, change in vision, nasal discharge, CVS- denies chest pain, palpitations RESP- denies SOB, cough, wheeze ABD- denies N/V, change in stools, abd pain GU- denies dysuria, hematuria, dribbling, incontinence MSK- denies joint pain, muscle aches, injury Neuro- denies headache, dizziness, syncope, seizure activity      Objective:   Physical Exam GEN- NAD, alert and oriented x3,ambulates with walker HEENT- PERRL, EOMI, non injected sclera, pink conjunctiva, MMM, oropharynx clear Neck- Supple,  CVS- RRR, no murmur RESP-CTAB ABD-NABS,soft,NT,ND EXT- No edema Pulses- Radial, DP- 2+ Foot- no open lesions, no callus build-up         Assessment & Plan:

## 2012-03-07 NOTE — Patient Instructions (Addendum)
Refill on medications Get labs done today for kidney and pre diabetes F/U 4 months

## 2012-03-07 NOTE — Assessment & Plan Note (Signed)
F/u cardiology, doing well no chest pain

## 2012-03-10 ENCOUNTER — Ambulatory Visit: Payer: Medicare Other | Admitting: Cardiology

## 2012-03-14 ENCOUNTER — Encounter (INDEPENDENT_AMBULATORY_CARE_PROVIDER_SITE_OTHER): Payer: Self-pay | Admitting: *Deleted

## 2012-03-25 ENCOUNTER — Encounter: Payer: Self-pay | Admitting: Adult Health

## 2012-03-25 ENCOUNTER — Ambulatory Visit (INDEPENDENT_AMBULATORY_CARE_PROVIDER_SITE_OTHER): Payer: Medicare Other | Admitting: Adult Health

## 2012-03-25 VITALS — BP 134/88 | HR 88 | Wt 165.0 lb

## 2012-03-25 DIAGNOSIS — I2510 Atherosclerotic heart disease of native coronary artery without angina pectoris: Secondary | ICD-10-CM

## 2012-03-25 DIAGNOSIS — I10 Essential (primary) hypertension: Secondary | ICD-10-CM

## 2012-03-25 DIAGNOSIS — E785 Hyperlipidemia, unspecified: Secondary | ICD-10-CM

## 2012-03-25 NOTE — Patient Instructions (Addendum)
Your physician recommends that you schedule a follow-up appointment in: 1 year  

## 2012-03-25 NOTE — Assessment & Plan Note (Signed)
Review of recent cholesterol studies on Mar 07, 2012 demonstrate TC 172 (prior 160), HDL 46 (prior 48), LDL 99 (prior 67), TG 136 (prior 227).  She has adhered to low cholesterol diet.  She is limited on exercise capacity as she is dependent on walker for ambulation.  I have discussed the results with her, and congratulated her on her TG statis. She is to continue this diet.

## 2012-03-25 NOTE — Assessment & Plan Note (Signed)
Followed by Dr Jeanice Lim, with improvement per labs as reviewed.

## 2012-03-25 NOTE — Progress Notes (Signed)
   HPI: [name] is a pleasant 56 y/o patient of Dr. Diona Browner that we follow for CAD, hypertension and hypercholesterolemia.  She is followed by Dr. Jeanice Lim for CKD stage III, and has had recent labs with Creatinine of 1.06 improved from 1.15. She is without complaint of chest pain, DOE or fatigue.  No Known Allergies  Current Outpatient Prescriptions  Medication Sig Dispense Refill  . albuterol (PROVENTIL HFA;VENTOLIN HFA) 108 (90 BASE) MCG/ACT inhaler Inhale 2 puffs into the lungs every 4 (four) hours as needed for wheezing.  1 Inhaler  0  . amLODipine (NORVASC) 10 MG tablet Take 1 tablet (10 mg total) by mouth daily.  90 tablet  1  . aspirin (ASPIRIN EC) 81 MG EC tablet Take 81 mg by mouth daily. Swallow whole.      Marland Kitchen atorvastatin (LIPITOR) 40 MG tablet Take 1 tablet (40 mg total) by mouth daily.  90 tablet  1  . Omega-3 Fatty Acids (FISH OIL) 1000 MG CAPS Take 1 capsule by mouth daily.      Marland Kitchen omeprazole (PRILOSEC) 20 MG capsule Take 1 capsule (20 mg total) by mouth daily.  90 capsule  1    Past Medical History  Diagnosis Date  . Essential hypertension, benign   . Myocardial infarction   . CVA (cerebral vascular accident)   . Urinary incontinence   . CKD (chronic kidney disease)     Stage 3  . Renal artery stenosis     Bilateral, right stent  . 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 Coronary atherosclerosis of native coronary artery     Occluded LAD (stent [facility]) with collaterals  . Asthmatic bronchitis   . Shingles     Face    Past Surgical History  Procedure Date  . Tubal ligation   . Coronary angioplasty with stent placement     RUE:AVWUJW of systems complete and found to be negative unless listed above PHYSICAL EXAM BP 134/88  Pulse 88  Wt 165 lb (74.844 kg)  General: Well developed, well nourished, in no acute distress Head: Eyes PERRLA, No xanthomas.   Normal cephalic and atramatic  Lungs: Clear bilaterally to auscultation and  percussion. Heart: HRRR S1 S2, 1/6.  Pulses are 2+ & equal.            No carotid bruit. No JVD.  No abdominal bruits. No femoral bruits. Abdomen: Bowel sounds are positive, abdomen soft and non-tender without masses or                  Hernia's noted. Msk:  Back normal, normal gait. Normal strength and tone for age. Extremities: No clubbing, cyanosis or mild non-pitting NSS edema.  DP +1 Neuro: Alert and oriented X 3. Psych:  Good affect, responds appropriately  EKG:NSR with questionable septal Q-waves.  ASSESSMENT AND PLAN

## 2012-03-25 NOTE — Assessment & Plan Note (Signed)
Excellent control of BP.  Refills are authorized.

## 2012-03-28 ENCOUNTER — Ambulatory Visit (INDEPENDENT_AMBULATORY_CARE_PROVIDER_SITE_OTHER): Payer: Medicare Other | Admitting: Internal Medicine

## 2012-03-28 ENCOUNTER — Encounter (INDEPENDENT_AMBULATORY_CARE_PROVIDER_SITE_OTHER): Payer: Self-pay | Admitting: Internal Medicine

## 2012-03-28 ENCOUNTER — Other Ambulatory Visit (INDEPENDENT_AMBULATORY_CARE_PROVIDER_SITE_OTHER): Payer: Self-pay | Admitting: *Deleted

## 2012-03-28 ENCOUNTER — Telehealth (INDEPENDENT_AMBULATORY_CARE_PROVIDER_SITE_OTHER): Payer: Self-pay | Admitting: *Deleted

## 2012-03-28 VITALS — BP 118/82 | HR 80 | Temp 98.0°F | Ht 61.0 in | Wt 165.3 lb

## 2012-03-28 DIAGNOSIS — Z1211 Encounter for screening for malignant neoplasm of colon: Secondary | ICD-10-CM

## 2012-03-28 MED ORDER — PEG-KCL-NACL-NASULF-NA ASC-C 100 G PO SOLR: 1.0000 | Freq: Once | ORAL | Status: DC

## 2012-03-28 NOTE — Patient Instructions (Addendum)
The risks and benefits such as perforation, bleeding, and infection were reviewed with the patient and is agreeable. 

## 2012-03-28 NOTE — Progress Notes (Addendum)
Subjective:     Patient ID: [name], female   DOB: Mar 17, 1956, 56 y.o.   MRN: [id number]  HPI Referred to our office for a screening colonoscopy.  Appetite is good. No unintentional weight.  Acid reflux controlled with Omeprazole. No abdominal pain. No melena or bright red rectal bleeding.  Has a BM one a day or every 2 days.   Review of Systems see hpi Current Outpatient Prescriptions  Medication Sig Dispense Refill  . amLODipine (NORVASC) 10 MG tablet Take 1 tablet (10 mg total) by mouth daily.  90 tablet  1  . aspirin (ASPIRIN EC) 81 MG EC tablet Take 81 mg by mouth daily. Swallow whole.      Marland Kitchen atorvastatin (LIPITOR) 40 MG tablet Take 1 tablet (40 mg total) by mouth daily.  90 tablet  1  . Omega-3 Fatty Acids (FISH OIL) 1000 MG CAPS Take 1 capsule by mouth daily.      Marland Kitchen omeprazole (PRILOSEC) 20 MG capsule Take 1 capsule (20 mg total) by mouth daily.  90 capsule  1   Past Medical History  Diagnosis Date  . Essential hypertension, benign   . Myocardial infarction   . CVA (cerebral vascular accident)   . Urinary incontinence   . CKD (chronic kidney disease)     Stage 3  . Renal artery stenosis     Bilateral, right stent  . 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 Coronary atherosclerosis of native coronary artery     Occluded LAD (stent [facility]) with collaterals  . Asthmatic bronchitis   . Shingles     Face        Objective:   Physical Exam  Filed Vitals:   03/28/12 1002  BP: 118/82  Pulse: 80  Temp: 98 F (36.7 C)  Height: 5\' 1"  (1.549 m)  Weight: 165 lb 4.8 oz (74.98 kg)  Alert and oriented. Skin warm and dry. Oral mucosa is moist.   . Sclera anicteric, conjunctivae is pink. Thyroid not enlarged. No cervical lymphadenopathy. Lungs clear. Heart regular rate and rhythm.  Abdomen is soft. Bowel sounds are positive. No hepatomegaly. No abdominal masses felt. No tenderness.  No edema to lower extremities.  Walks with a walker.      Assessment:    Screening colonoscopy. Discussed procedure with patient and risks.    Plan:    Screening colonoscopy

## 2012-03-28 NOTE — Telephone Encounter (Signed)
Patient needs movi prep 

## 2012-04-10 IMAGING — CT CT HEAD W/O CM
1 series · 15 of 30 positions shown, 19 images · non-contrast
Comparison: CT of the head, and MRI/MRA of the brain, performed
05/01/2005

CLINICAL DATA: Left arm weakness and numbness.

CT HEAD WITHOUT CONTRAST
TECHNIQUE: Contiguous axial images were obtained from the base of
the skull through the vertex without contrast.

[Series 2: headseq 4.8 h37s · axial · 0.43mm/px · z∈[+57,+217]mm · 15 of 36 slices shown, 19 images]
[im 2/36  brain]
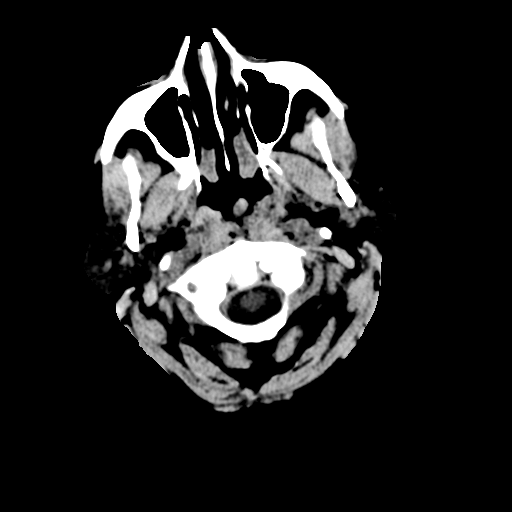
[im 2/36  bone]
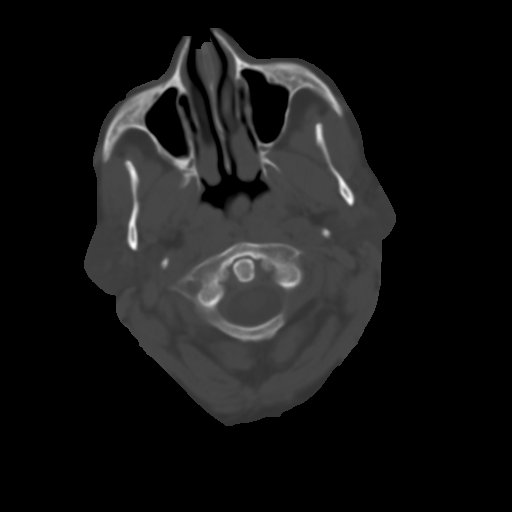
[im 4/36  brain]
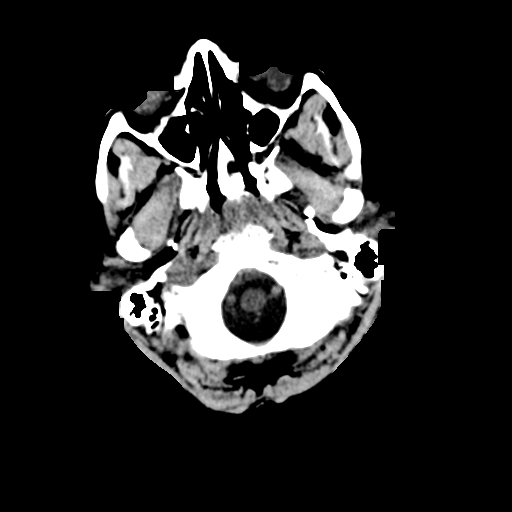
[im 7/36  brain]
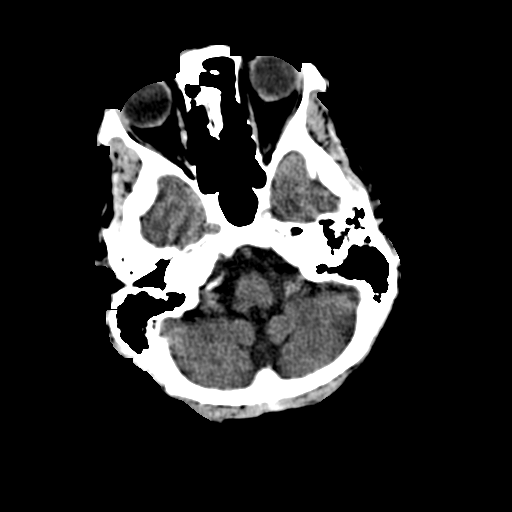
[im 9/36  brain]
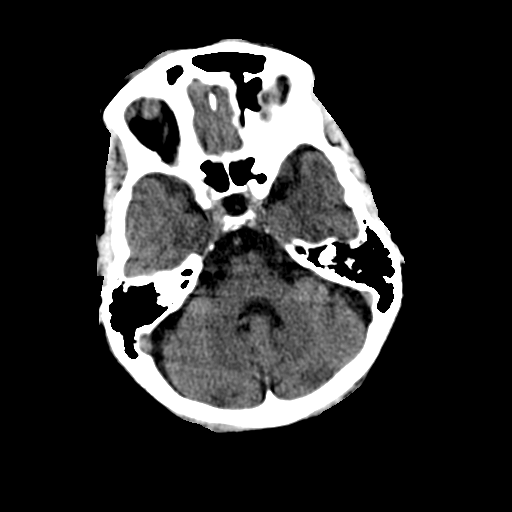
[im 11/36  brain]
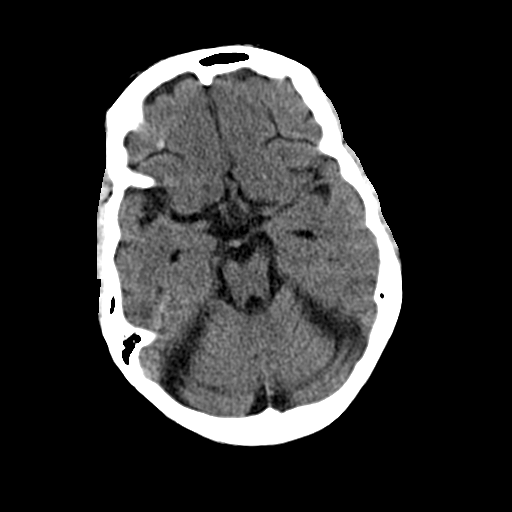
[im 11/36  bone]
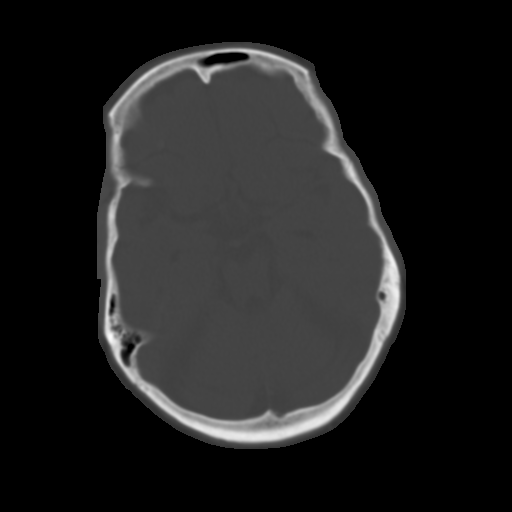
[im 14/36  brain]
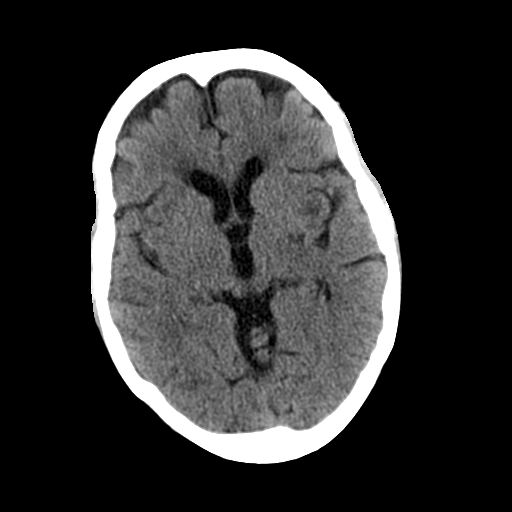
[im 16/36  brain]
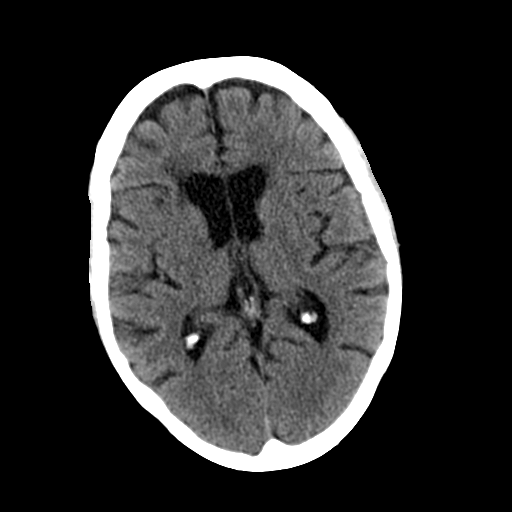
[im 19/36  brain]
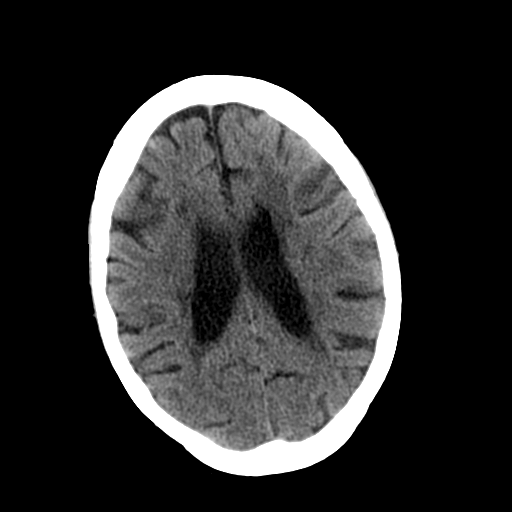
[im 20/36  brain]
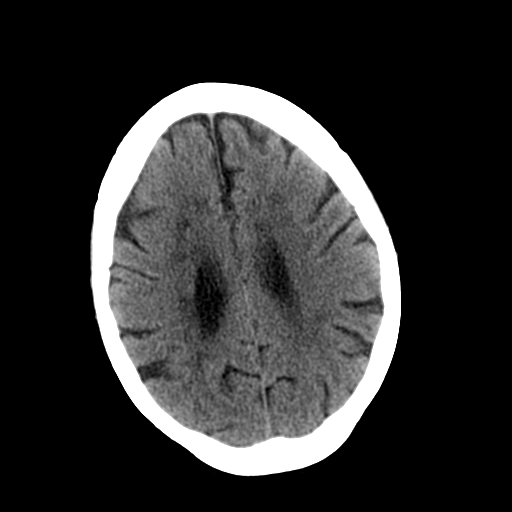
[im 20/36  bone]
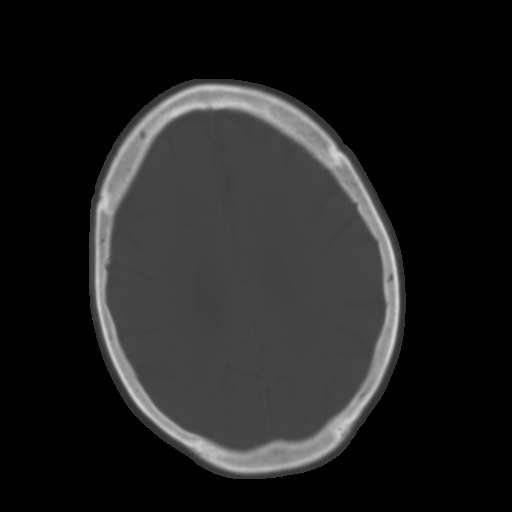
[im 22/36  brain]
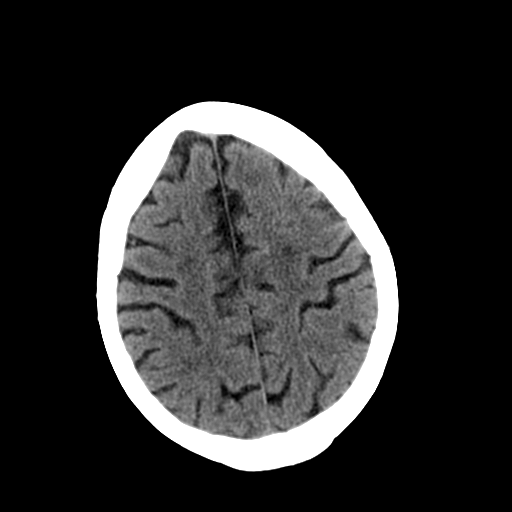
[im 25/36  brain]
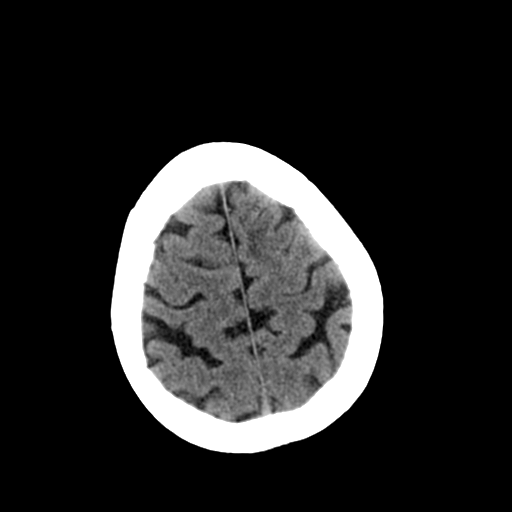
[im 27/36  brain]
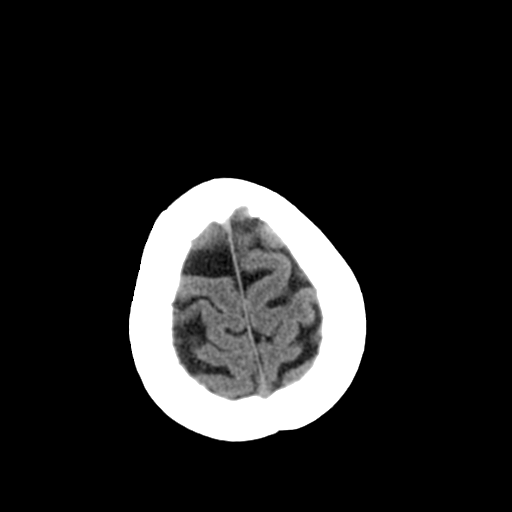
[im 29/36  brain]
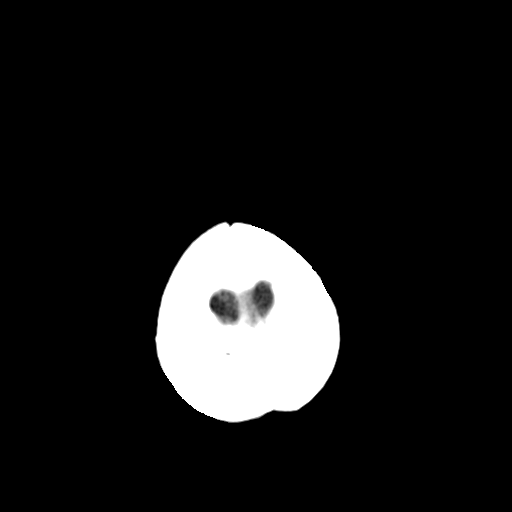
[im 29/36  bone]
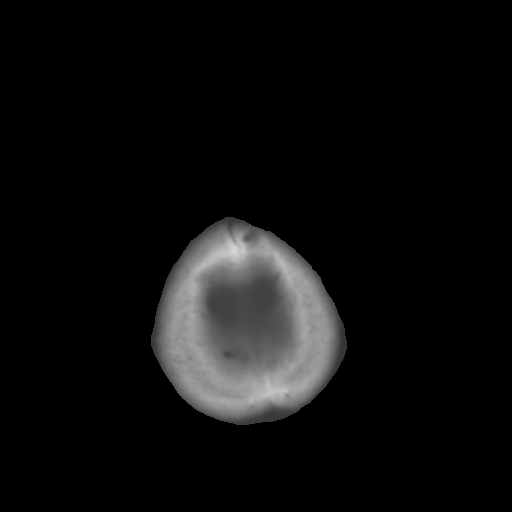
[im 32/36  brain]
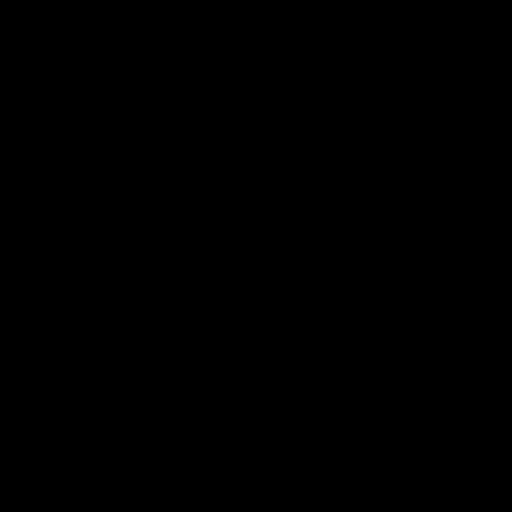
[im 34/36  brain]
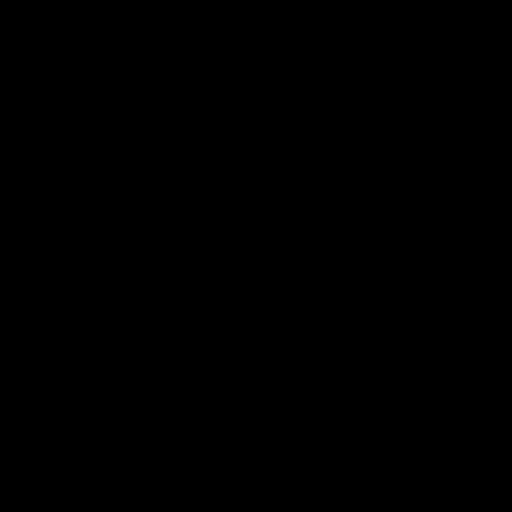

[15 of 30 positions shown; findings below may reference images not displayed]

FINDINGS: There is no evidence of acute infarction, mass lesion, or
intra- or extra-axial hemorrhage on CT.

Prominence of the ventricles and sulci reflects mild cortical
volume loss.  There is unusual prominence of periventricular and
subcortical white matter change; this appears grossly stable from
8991.  Would correlate for symptoms of demyelinating disease; on
comparison with the prior MRI, this may simply reflect extensive
small vessel ischemic microangiopathy.

A small chronic lacunar infarct is noted within the left thalamus.
Cerebellar atrophy is noted.

The brainstem and fourth ventricle are within normal limits.  The
cerebral hemispheres demonstrate grossly normal gray-white
differentiation.  No mass effect or midline shift is seen.

There is no evidence of fracture; visualized osseous structures are
unremarkable in appearance.  The orbits are within normal limits.
The paranasal sinuses and mastoid air cells are well-aerated.  No
significant soft tissue abnormalities are seen.
IMPRESSION: 1.  No acute intracranial pathology seen on CT.
2.  Mild cortical volume loss noted.
3.  Unusual prominence of periventricular and subcortical white
matter change; this appears grossly stable from 8991.  Would
correlate for symptoms of demyelinating disease; on comparison with
the prior MRI, this may simply reflect extensive small vessel
ischemic microangiopathy.
4.  Small chronic lacunar infarct within the left thalamus.

## 2012-04-12 ENCOUNTER — Encounter (HOSPITAL_COMMUNITY): Payer: Self-pay | Admitting: Pharmacy Technician

## 2012-04-26 MED ORDER — SODIUM CHLORIDE 0.45 % IV SOLN
INTRAVENOUS | Status: DC
  Administered 2012-04-27: 1000 mL via INTRAVENOUS

## 2012-04-27 ENCOUNTER — Encounter (HOSPITAL_COMMUNITY)
Admission: RE | Disposition: A | Discharge status: Home / Self Care | Payer: Self-pay | Source: Ambulatory Visit | Attending: Internal Medicine

## 2012-04-27 ENCOUNTER — Ambulatory Visit (HOSPITAL_COMMUNITY)
Admission: RE | Admit: 2012-04-27 | Discharge: 2012-04-27 | Disposition: A | Discharge status: Home / Self Care | Payer: Medicare Other | Source: Ambulatory Visit | Attending: Internal Medicine | Admitting: Internal Medicine

## 2012-04-27 DIAGNOSIS — Z1211 Encounter for screening for malignant neoplasm of colon: Secondary | ICD-10-CM

## 2012-04-27 DIAGNOSIS — D126 Benign neoplasm of colon, unspecified: Secondary | ICD-10-CM | POA: Insufficient documentation

## 2012-04-27 DIAGNOSIS — K5730 Diverticulosis of large intestine without perforation or abscess without bleeding: Secondary | ICD-10-CM

## 2012-04-27 DIAGNOSIS — N183 Chronic kidney disease, stage 3 unspecified: Secondary | ICD-10-CM | POA: Insufficient documentation

## 2012-04-27 DIAGNOSIS — I129 Hypertensive chronic kidney disease with stage 1 through stage 4 chronic kidney disease, or unspecified chronic kidney disease: Secondary | ICD-10-CM | POA: Insufficient documentation

## 2012-04-27 HISTORY — PX: COLONOSCOPY: SHX5424

## 2012-04-27 SURGERY — COLONOSCOPY
Anesthesia: Moderate Sedation

## 2012-04-27 MED ORDER — MEPERIDINE HCL 50 MG/ML IJ SOLN
INTRAMUSCULAR | Status: DC | PRN
  Administered 2012-04-27 (×2): 25 mg via INTRAVENOUS

## 2012-04-27 MED ORDER — ONDANSETRON HCL 4 MG/2ML IJ SOLN
INTRAMUSCULAR | Status: DC | PRN
  Administered 2012-04-27: 4 mg via INTRAVENOUS

## 2012-04-27 MED ORDER — STERILE WATER FOR IRRIGATION IR SOLN
Status: DC | PRN
  Administered 2012-04-27: 14:00:00

## 2012-04-27 MED ORDER — MEPERIDINE HCL 50 MG/ML IJ SOLN
INTRAMUSCULAR | Status: AC
  Filled 2012-04-27: qty 1

## 2012-04-27 MED ORDER — ONDANSETRON HCL 4 MG/2ML IJ SOLN
INTRAMUSCULAR | Status: AC
  Filled 2012-04-27: qty 2

## 2012-04-27 MED ORDER — MIDAZOLAM HCL 5 MG/5ML IJ SOLN
INTRAMUSCULAR | Status: DC | PRN
  Administered 2012-04-27 (×2): 2 mg via INTRAVENOUS
  Administered 2012-04-27: 1 mg via INTRAVENOUS

## 2012-04-27 MED ORDER — MIDAZOLAM HCL 5 MG/5ML IJ SOLN
INTRAMUSCULAR | Status: AC
  Filled 2012-04-27: qty 10

## 2012-04-27 NOTE — H&P (Signed)
[name] is an 56 y.o. female.   Chief Complaint: Patient is here for colonoscopy. HPI: Patient is 56 year old Caucasian female who is here for screening colonoscopy. She denies abdominal pain change in her bowel habits or rectal bleeding. Family history is negative for colorectal carcinoma. Past Medical History  Diagnosis Date  . Essential hypertension, benign   . Myocardial infarction   . CVA (cerebral vascular accident)   . Urinary incontinence   . CKD (chronic kidney disease)     Stage 3  . Renal artery stenosis     Bilateral, right stent  . 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 Coronary atherosclerosis of native coronary artery     Occluded LAD (stent SE[facility]) with collaterals  . Asthmatic bronchitis   . Shingles     Face    Past Surgical History  Procedure Date  . Tubal ligation   . Coronary angioplasty with stent placement     Family History  Problem Relation Age of Onset  . Heart attack Mother   . Heart disease Mother   . Diabetes Mother   . Heart attack Father   . Heart disease Father   . Heart disease Brother   . Coronary artery disease Other   . Diabetes Sister    Social History:  reports that she has never smoked. She has never used smokeless tobacco. She reports that she does not drink alcohol or use illicit drugs.  Allergies: No Known Allergies  Medications Prior to Admission  Medication Sig Dispense Refill  . amLODipine (NORVASC) 10 MG tablet Take 1 tablet (10 mg total) by mouth daily.  90 tablet  1  . aspirin (ASPIRIN EC) 81 MG EC tablet Take 81 mg by mouth daily.       Marland Kitchen atorvastatin (LIPITOR) 40 MG tablet Take 1 tablet (40 mg total) by mouth daily.  90 tablet  1  . omeprazole (PRILOSEC) 20 MG capsule Take 1 capsule (20 mg total) by mouth daily.  90 capsule  1  . peg 3350 powder (MOVIPREP) 100 G SOLR Take 1 kit (100 g total) by mouth once.  1 kit  0  . Omega-3 Fatty Acids (FISH OIL) 1000 MG CAPS Take 1 capsule by mouth daily.         No results found for this or any previous visit (from the past 48 hour(s)). No results found.  ROS  Blood pressure 138/86, pulse 91, temperature 98.2 F (36.8 C), temperature source Oral, resp. rate 20, height 5\' 1"  (1.549 m), weight 160 lb (72.576 kg), SpO2 94.00%. Physical Exam  Constitutional: She appears well-developed and well-nourished.  HENT:  Mouth/Throat: Oropharynx is clear and moist.  Eyes: Conjunctivae normal are normal. No scleral icterus.  Neck: No thyromegaly present.  Cardiovascular: Normal rate, regular rhythm and normal heart sounds.   No murmur heard. Respiratory: Effort normal and breath sounds normal.  GI: Soft. She exhibits no distension and no mass. There is no tenderness.  Musculoskeletal: She exhibits no edema.  Lymphadenopathy:    She has no cervical adenopathy.  Neurological: She is alert.  Skin: Skin is warm and dry.     Assessment/Plan Average risk screening colonoscopy.  REHMAN,NAJEEB U 04/27/2012, 1:52 PM

## 2012-04-27 NOTE — Op Note (Signed)
COLONOSCOPY PROCEDURE REPORT  PATIENT:  [name]  MR#:  [id number] Birthdate:  July 18, 1955, 56 y.o., female Endoscopist:  Dr. Malissa Hippo, MD Referred By:  Dr. Milinda Antis, MD Procedure Date: 04/27/2012  Procedure:   Colonoscopy  Indications: Patient is 56 year old Caucasian female was undergoing average risk screening colonoscopy.  Informed Consent:  The procedure and risks were reviewed with the patient and informed consent was obtained.  Medications:  Demerol 50 mg IV Versed 5 mg IV Patient was also given 4 mg of ondansetron IV prior to procedure for nausea  Description of procedure:  After a digital rectal exam was performed, that colonoscope was advanced from the anus through the rectum and colon to the area of the cecum, ileocecal valve and appendiceal orifice. The cecum was deeply intubated. These structures were well-seen and photographed for the record. From the level of the cecum and ileocecal valve, the scope was slowly and cautiously withdrawn. The mucosal surfaces were carefully surveyed utilizing scope tip to flexion to facilitate fold flattening as needed. The scope was pulled down into the rectum where a thorough exam including retroflexion was performed.  Findings:   Prep excellent. 4 mm polyp ablated via cold biopsy from cecum. Few scattered diverticula at sigmoid colon. Normal rectal mucosa and anal rectal junction.  Therapeutic/Diagnostic Maneuvers Performed:  See above  Complications:  None  Cecal Withdrawal Time:  8  minutes  Impression:  Examination performed to cecum. 4 mm cecal polyp ablated via cold biopsy. Mild sigmoid colon diverticulosis.  Recommendations:  Standard instructions given. I would be contacting patient with biopsy results. Since she has one small polyp she could wait 10 years before her next exam. Romaine Maciolek U  04/27/2012 2:18 PM  CC: Dr. Milinda Antis, MD & Dr. Bonnetta Barry ref. provider found

## 2012-04-29 ENCOUNTER — Encounter (HOSPITAL_COMMUNITY): Payer: Self-pay | Admitting: Internal Medicine

## 2012-05-20 ENCOUNTER — Encounter (INDEPENDENT_AMBULATORY_CARE_PROVIDER_SITE_OTHER): Payer: Self-pay | Admitting: *Deleted

## 2012-07-08 ENCOUNTER — Encounter: Payer: Self-pay | Admitting: Family Medicine

## 2012-07-08 ENCOUNTER — Ambulatory Visit (INDEPENDENT_AMBULATORY_CARE_PROVIDER_SITE_OTHER): Payer: Medicare Other | Admitting: Family Medicine

## 2012-07-08 VITALS — BP 126/80 | HR 91 | Resp 18 | Ht 61.5 in | Wt 163.0 lb

## 2012-07-08 DIAGNOSIS — E669 Obesity, unspecified: Secondary | ICD-10-CM

## 2012-07-08 DIAGNOSIS — Z1239 Encounter for other screening for malignant neoplasm of breast: Secondary | ICD-10-CM

## 2012-07-08 DIAGNOSIS — Z13820 Encounter for screening for osteoporosis: Secondary | ICD-10-CM

## 2012-07-08 DIAGNOSIS — J45909 Unspecified asthma, uncomplicated: Secondary | ICD-10-CM

## 2012-07-08 DIAGNOSIS — I10 Essential (primary) hypertension: Secondary | ICD-10-CM

## 2012-07-08 DIAGNOSIS — E785 Hyperlipidemia, unspecified: Secondary | ICD-10-CM

## 2012-07-08 MED ORDER — ATORVASTATIN CALCIUM 40 MG PO TABS: 40.0000 mg | ORAL_TABLET | Freq: Every day | ORAL | Status: DC

## 2012-07-08 MED ORDER — AMLODIPINE BESYLATE 10 MG PO TABS: 10.0000 mg | ORAL_TABLET | Freq: Every day | ORAL | Status: DC

## 2012-07-08 MED ORDER — OMEPRAZOLE 20 MG PO CPDR: 20.0000 mg | DELAYED_RELEASE_CAPSULE | Freq: Every day | ORAL | Status: DC

## 2012-07-08 NOTE — Patient Instructions (Signed)
You are doing well! Continue current medications Schedule Mammogram -call the number provided F/U 4 months

## 2012-07-08 NOTE — Assessment & Plan Note (Signed)
She has lost a couple pounds he is trying to get out during the warmer months this is difficult as she or he walks with a walker secondary to her previous stroke She's been watching her diet

## 2012-07-08 NOTE — Assessment & Plan Note (Signed)
Asthma well-controlled she does not require any rescue inhaler

## 2012-07-08 NOTE — Assessment & Plan Note (Signed)
Lipid panel looks good on Lipitor continue

## 2012-07-08 NOTE — Assessment & Plan Note (Signed)
Blood pressure well controlled

## 2012-07-08 NOTE — Progress Notes (Signed)
  Subjective:    Patient ID: Orion Crook, female    DOB: [birth year], 57 y.o.   MRN: [id number]  HPI  Patient here to follow chronic medical problems. She has no specific concerns. She's tolerating her medications without any difficulties. She had her colonoscopy. She's due for mammogram and bone density for this year  Review of Systems   GEN- denies fatigue, fever, weight loss,weakness, recent illness HEENT- denies eye drainage, change in vision, nasal discharge, CVS- denies chest pain, palpitations RESP- denies SOB, cough, wheeze ABD- denies N/V, change in stools, abd pain GU- denies dysuria, hematuria, dribbling, incontinence MSK- denies joint pain, muscle aches, injury Neuro- denies headache, dizziness, syncope, seizure activity      Objective:   Physical Exam GEN- NAD, alert and oriented x3,ambulates with walker  HEENT- PERRL, EOMI, non injected sclera, pink conjunctiva, MMM, oropharynx clear  Neck- Supple,  CVS- RRR, no murmur  RESP-CTAB  EXT- No edema  Pulses- Radial, DP- 2+          Assessment & Plan:

## 2012-08-17 ENCOUNTER — Ambulatory Visit (HOSPITAL_COMMUNITY)
Admission: RE | Admit: 2012-08-17 | Discharge: 2012-08-17 | Disposition: A | Discharge status: Home / Self Care | Payer: Medicare Other | Source: Ambulatory Visit | Attending: Family Medicine | Admitting: Family Medicine

## 2012-08-17 DIAGNOSIS — Z13820 Encounter for screening for osteoporosis: Secondary | ICD-10-CM

## 2012-08-17 DIAGNOSIS — Z780 Asymptomatic menopausal state: Secondary | ICD-10-CM | POA: Insufficient documentation

## 2012-08-17 DIAGNOSIS — M810 Age-related osteoporosis without current pathological fracture: Secondary | ICD-10-CM | POA: Insufficient documentation

## 2012-08-19 ENCOUNTER — Other Ambulatory Visit: Payer: Self-pay | Admitting: Family Medicine

## 2012-08-19 ENCOUNTER — Other Ambulatory Visit: Payer: Self-pay

## 2012-08-19 ENCOUNTER — Encounter: Payer: Self-pay | Admitting: Family Medicine

## 2012-08-19 MED ORDER — ALENDRONATE SODIUM 70 MG PO TABS: 70.0000 mg | ORAL_TABLET | ORAL | Status: DC

## 2012-10-17 ENCOUNTER — Other Ambulatory Visit: Payer: Self-pay | Admitting: Family Medicine

## 2012-10-17 NOTE — Telephone Encounter (Signed)
Med rf °

## 2012-10-17 NOTE — Telephone Encounter (Signed)
Forwarding to new office  

## 2012-11-08 ENCOUNTER — Ambulatory Visit: Payer: Medicare Other | Admitting: Family Medicine

## 2012-11-08 ENCOUNTER — Ambulatory Visit (INDEPENDENT_AMBULATORY_CARE_PROVIDER_SITE_OTHER): Payer: Medicare Other | Admitting: Family Medicine

## 2012-11-08 ENCOUNTER — Encounter: Payer: Self-pay | Admitting: Family Medicine

## 2012-11-08 VITALS — BP 132/84 | HR 64 | Temp 97.0°F | Resp 18 | Wt 164.0 lb

## 2012-11-08 DIAGNOSIS — E785 Hyperlipidemia, unspecified: Secondary | ICD-10-CM

## 2012-11-08 DIAGNOSIS — Z1231 Encounter for screening mammogram for malignant neoplasm of breast: Secondary | ICD-10-CM

## 2012-11-08 DIAGNOSIS — R7309 Other abnormal glucose: Secondary | ICD-10-CM

## 2012-11-08 DIAGNOSIS — I10 Essential (primary) hypertension: Secondary | ICD-10-CM

## 2012-11-08 DIAGNOSIS — R7303 Prediabetes: Secondary | ICD-10-CM

## 2012-11-08 DIAGNOSIS — I2510 Atherosclerotic heart disease of native coronary artery without angina pectoris: Secondary | ICD-10-CM

## 2012-11-08 DIAGNOSIS — N183 Chronic kidney disease, stage 3 unspecified: Secondary | ICD-10-CM

## 2012-11-08 DIAGNOSIS — M810 Age-related osteoporosis without current pathological fracture: Secondary | ICD-10-CM

## 2012-11-08 LAB — COMPLETE METABOLIC PANEL WITH GFR
ALT: 16 U/L (ref 0–35)
AST: 18 U/L (ref 0–37)
Albumin: 4.4 g/dL (ref 3.5–5.2)
CO2: 24 mEq/L (ref 19–32)
Calcium: 9.8 mg/dL (ref 8.4–10.5)
Chloride: 104 mEq/L (ref 96–112)
Creat: 1.1 mg/dL (ref 0.50–1.10)
GFR, Est African American: 64 mL/min
Potassium: 4.6 mEq/L (ref 3.5–5.3)
Total Protein: 8.1 g/dL (ref 6.0–8.3)

## 2012-11-08 LAB — CBC
HCT: 40.8 % (ref 36.0–46.0)
Hemoglobin: 13.5 g/dL (ref 12.0–15.0)
RBC: 4.75 MIL/uL (ref 3.87–5.11)
WBC: 7.6 10*3/uL (ref 4.0–10.5)

## 2012-11-08 LAB — LIPID PANEL
LDL Cholesterol: 76 mg/dL (ref 0–99)
Triglycerides: 350 mg/dL — ABNORMAL HIGH (ref <150)
VLDL: 70 mg/dL — ABNORMAL HIGH (ref 0–40)

## 2012-11-08 LAB — HEMOGLOBIN A1C: Mean Plasma Glucose: 120 mg/dL — ABNORMAL HIGH (ref <117)

## 2012-11-08 MED ORDER — OMEPRAZOLE 20 MG PO CPDR: 20.0000 mg | DELAYED_RELEASE_CAPSULE | Freq: Every day | ORAL | Status: DC

## 2012-11-08 MED ORDER — AMLODIPINE BESYLATE 10 MG PO TABS: 10.0000 mg | ORAL_TABLET | Freq: Every day | ORAL | Status: AC

## 2012-11-08 MED ORDER — ATORVASTATIN CALCIUM 40 MG PO TABS: 40.0000 mg | ORAL_TABLET | Freq: Every day | ORAL | Status: AC

## 2012-11-08 MED ORDER — ALENDRONATE SODIUM 70 MG PO TABS: 70.0000 mg | ORAL_TABLET | ORAL | Status: DC

## 2012-11-08 MED ORDER — NITROGLYCERIN 0.4 MG SL SUBL: 0.4000 mg | SUBLINGUAL_TABLET | SUBLINGUAL | Status: AC | PRN

## 2012-11-08 NOTE — Assessment & Plan Note (Signed)
Check creatinine this has been stable

## 2012-11-08 NOTE — Assessment & Plan Note (Signed)
Lipid panel to be checked today currently on Lipitor

## 2012-11-08 NOTE — Assessment & Plan Note (Signed)
She's been doing well will followup with cardiology this fall. She did ask for refill on the nitroglycerin because hers had expired many years ago and she wanted to have some [address]

## 2012-11-08 NOTE — Assessment & Plan Note (Signed)
Well-controlled. She gets very minimal swelling this is mostly from standing long periods of time. I've advised her to use support hose

## 2012-11-08 NOTE — Progress Notes (Signed)
  Subjective:    Patient ID: [name], female    DOB: 06-Mar-1956, 57 y.o.   MRN: [id number]  HPI  Patient here to follow chronic medical problems. She has no specific concerns. She is due for fasting labs today. She's taking her Fosamax for osteoporosis noted on DEXA scan this year. She is following with cardiology she is due to see them in November She still overdue for her mammogram  Medications and history reviewed   Review of Systems   GEN- denies fatigue, fever, weight loss,weakness, recent illness HEENT- denies eye drainage, change in vision, nasal discharge, CVS- denies chest pain, palpitations, occasional leg swelling RESP- denies SOB, cough, wheeze ABD- denies N/V, change in stools, abd pain GU- denies dysuria, hematuria, dribbling, incontinence MSK- denies joint pain, muscle aches, injury Neuro- denies headache, dizziness, syncope, seizure activity      Objective:   Physical Exam GEN- NAD, alert and oriented x3 HEENT- PERRL, EOMI, non injected sclera, pink conjunctiva, MMM, oropharynx clear Neck- Supple, no thyromegaly, no bruit CVS- RRR, no murmur RESP-CTAB ABD-NABS,soft,NTND, no JVD EXT- trace pedal edema Pulses- Radial, DP- 2+        Assessment & Plan:

## 2012-11-08 NOTE — Patient Instructions (Signed)
Labs to be done We will call with results Continue current medications Mammogram to be set up F/U 4 months

## 2012-11-08 NOTE — Addendum Note (Signed)
Addended by: Elvina Mattes T on: 11/08/2012 02:41 PM   Modules accepted: Orders

## 2012-11-08 NOTE — Assessment & Plan Note (Signed)
Check A1c. She is watching her diet very carefully

## 2012-11-10 NOTE — Addendum Note (Signed)
Addended by: Elvina Mattes T on: 11/10/2012 10:18 AM   Modules accepted: Orders

## 2012-11-13 LAB — VITAMIN D 1,25 DIHYDROXY
Vitamin D 1, 25 (OH)2 Total: 72 pg/mL (ref 18–72)
Vitamin D2 1, 25 (OH)2: <8 pg/mL
Vitamin D3 1, 25 (OH)2: 72 pg/mL

## 2012-11-24 ENCOUNTER — Ambulatory Visit (HOSPITAL_COMMUNITY)
Admission: RE | Admit: 2012-11-24 | Discharge: 2012-11-24 | Disposition: A | Discharge status: Home / Self Care | Payer: Medicare Other | Source: Ambulatory Visit | Attending: Family Medicine | Admitting: Family Medicine

## 2012-11-24 DIAGNOSIS — Z1231 Encounter for screening mammogram for malignant neoplasm of breast: Secondary | ICD-10-CM | POA: Insufficient documentation

## 2013-03-10 ENCOUNTER — Ambulatory Visit: Payer: Medicare Other | Admitting: Family Medicine

## 2013-03-17 ENCOUNTER — Ambulatory Visit (INDEPENDENT_AMBULATORY_CARE_PROVIDER_SITE_OTHER): Payer: Medicare Other | Admitting: Family Medicine

## 2013-03-17 VITALS — BP 130/80 | HR 78 | Temp 98.2°F | Resp 18 | Wt 163.0 lb

## 2013-03-17 DIAGNOSIS — I10 Essential (primary) hypertension: Secondary | ICD-10-CM

## 2013-03-17 DIAGNOSIS — E785 Hyperlipidemia, unspecified: Secondary | ICD-10-CM

## 2013-03-17 DIAGNOSIS — N183 Chronic kidney disease, stage 3 unspecified: Secondary | ICD-10-CM

## 2013-03-17 DIAGNOSIS — Z8679 Personal history of other diseases of the circulatory system: Secondary | ICD-10-CM

## 2013-03-17 LAB — BASIC METABOLIC PANEL
CO2: 26 mEq/L (ref 19–32)
Calcium: 10.4 mg/dL (ref 8.4–10.5)
Chloride: 104 mEq/L (ref 96–112)
Creat: 1.05 mg/dL (ref 0.50–1.10)
Glucose, Bld: 106 mg/dL — ABNORMAL HIGH (ref 70–99)

## 2013-03-17 LAB — LIPID PANEL
Cholesterol: 191 mg/dL (ref 0–200)
HDL: 49 mg/dL (ref >39)
LDL Cholesterol: 91 mg/dL (ref 0–99)
Triglycerides: 255 mg/dL — ABNORMAL HIGH (ref <150)

## 2013-03-17 NOTE — Patient Instructions (Signed)
Continue current medications Take 2 capsules of fish oil twice a day  F/U 4 months

## 2013-03-18 ENCOUNTER — Encounter: Payer: Self-pay | Admitting: Family Medicine

## 2013-03-18 NOTE — Assessment & Plan Note (Signed)
Well control

## 2013-03-18 NOTE — Assessment & Plan Note (Signed)
Check metabolic panel.  

## 2013-03-18 NOTE — Assessment & Plan Note (Signed)
Doing well, continues to use walker, left sided weakness

## 2013-03-18 NOTE — Assessment & Plan Note (Signed)
Check FLP TG have been a problem, may need low dose fenofibrate as well On fish oil, increase to 2 gram BID

## 2013-03-18 NOTE — Progress Notes (Signed)
  Subjective:    Patient ID: Orion Crook, female    DOB: [birth year], 57 y.o.   MRN: [id number]  HPI  Pt here to f/u chronic medical problems. Has no concerns. Doing well, no CP, no NTG use. HTN- tolerating medications, tries to be active around the home.  Due for fasting labs for Hyperlipidemia, elevated TG Declines Flu shot   Review of Systems  GEN- denies fatigue, fever, weight loss,weakness, recent illness HEENT- denies eye drainage, change in vision, nasal discharge, CVS- denies chest pain, palpitations RESP- denies SOB, cough, wheeze ABD- denies N/V, change in stools, abd pain GU- denies dysuria, hematuria, dribbling, incontinence MSK- denies joint pain, muscle aches, injury Neuro- denies headache, dizziness, syncope, seizure activity       Objective:   Physical Exam GEN- NAD, alert and oriented x3 HEENT- PERRL, EOMI, non injected sclera, pink conjunctiva, MMM, oropharynx clear CVS- RRR, no murmur RESP-CTAB EXT- No edema Pulses- Radial, DP- 2+ Neuro- CNII-XII intact, left sided hemiparesis LLE, walks with walker        Assessment & Plan:

## 2013-03-23 ENCOUNTER — Encounter: Payer: Self-pay | Admitting: Family Medicine

## 2013-03-27 ENCOUNTER — Encounter: Payer: Medicare Other | Admitting: Cardiology

## 2013-03-27 ENCOUNTER — Encounter: Payer: Self-pay | Admitting: Cardiology

## 2013-03-27 NOTE — Progress Notes (Signed)
NNo-show. This encounter was created in error - please disregard. 

## 2013-07-17 ENCOUNTER — Ambulatory Visit: Payer: Medicare Other | Admitting: Family Medicine

## 2015-04-24 ENCOUNTER — Encounter: Payer: Self-pay | Admitting: Pediatrics

## 2015-04-24 ENCOUNTER — Ambulatory Visit (INDEPENDENT_AMBULATORY_CARE_PROVIDER_SITE_OTHER): Payer: Medicare Other | Admitting: Pediatrics

## 2015-04-24 VITALS — BP 131/86 | HR 73 | Temp 98.3°F | Ht 61.0 in | Wt 151.2 lb

## 2015-04-24 DIAGNOSIS — I701 Atherosclerosis of renal artery: Secondary | ICD-10-CM | POA: Diagnosis not present

## 2015-04-24 DIAGNOSIS — I2510 Atherosclerotic heart disease of native coronary artery without angina pectoris: Secondary | ICD-10-CM | POA: Diagnosis not present

## 2015-04-24 DIAGNOSIS — Z1331 Encounter for screening for depression: Secondary | ICD-10-CM

## 2015-04-24 DIAGNOSIS — Z1389 Encounter for screening for other disorder: Secondary | ICD-10-CM | POA: Diagnosis not present

## 2015-04-24 DIAGNOSIS — I10 Essential (primary) hypertension: Secondary | ICD-10-CM | POA: Diagnosis not present

## 2015-04-24 DIAGNOSIS — I6930 Unspecified sequelae of cerebral infarction: Secondary | ICD-10-CM | POA: Diagnosis not present

## 2015-04-24 DIAGNOSIS — K219 Gastro-esophageal reflux disease without esophagitis: Secondary | ICD-10-CM

## 2015-04-24 DIAGNOSIS — Z6828 Body mass index (BMI) 28.0-28.9, adult: Secondary | ICD-10-CM

## 2015-04-24 MED ORDER — FAMOTIDINE 20 MG PO CHEW: 20.0000 mg | CHEWABLE_TABLET | Freq: Every day | ORAL | Status: AC

## 2015-04-24 NOTE — Progress Notes (Signed)
Subjective:    Patient ID: [name], female    DOB: 04-22-1956, 59 y.o.   MRN: [id number]  CC: multiple med problem follow up  HPI: [name] is a 59 y.o. female presenting for New Patient (Initial Visit)  Moved back here recently from [place] some weight up in [place] due to stress Trying to make healthy decision re food Had a mammogram in Sept 2016 in [place], Dr. Laurey Morale in [place], [place] Pap smear in Sept Had a stroke a 59yo, using a walker due to weakness on the L side Living at home with sister No chest pain with exertion Mood pain fine since moving back down here Had a MI before the stroke  GER symptoms when she stops the omeprazole, no symptoms as long as she takes the omeprazole Overall feeling well today, no recent fevers, no weight loss. Happy with move.   Depression screen PHQ 2/9 04/24/2015  Decreased Interest 0  Down, Depressed, Hopeless 0  PHQ - 2 Score 0    Family History  Problem Relation Age of Onset  . Heart attack Mother   . Heart disease Mother   . Diabetes Mother   . Heart attack Father   . Heart disease Father   . Heart disease Brother   . Coronary artery disease Other   . Diabetes Sister    Social History   Social History  . Marital Status: Single    Spouse Name: N/A  . Number of Children: N/A  . Years of Education: N/A   Occupational History  . Not on file.   Social History Main Topics  . Smoking status: Never Smoker   . Smokeless tobacco: Never Used  . Alcohol Use: No  . Drug Use: No     Comment: History of cocaine  . Sexual Activity: Not on file   Other Topics Concern  . Not on file   Social History Narrative   No Known Allergies   ROS: All systems negative other than what is in HPI  History  Smoking status  . Never Smoker   Smokeless tobacco  . Never Used    Past Medical History Patient Active Problem List   Diagnosis Date Noted  . BMI 28.0-28.9,adult 04/24/2015  . Osteoporosis,  unspecified 11/08/2012  . Encounter for screening colonoscopy 03/28/2012  . Obesity, unspecified 11/05/2011  . Asthma 07/30/2011  . Prediabetes 06/03/2011  . ESSENTIAL HYPERTENSION, BENIGN 02/26/2009  . CORONARY ATHEROSCLEROSIS NATIVE CORONARY ARTERY 02/26/2009  . RENAL ARTERY STENOSIS 02/26/2009  . HYPERLIPIDEMIA 06/30/2006  . G E R D 06/30/2006  . KIDNEY DISEASE, CHRONIC, STAGE III 04/12/2006  . History of CVA with residual deficit 04/12/2006    Current Outpatient Prescriptions  Medication Sig Dispense Refill  . amLODipine (NORVASC) 10 MG tablet Take 1 tablet (10 mg total) by mouth daily. 90 tablet 1  . aspirin (ASPIRIN EC) 81 MG EC tablet Take 81 mg by mouth daily.     Marland Kitchen atorvastatin (LIPITOR) 40 MG tablet Take 1 tablet (40 mg total) by mouth daily. 90 tablet 1  . Omega-3 Fatty Acids (FISH OIL) 1000 MG CAPS Take 2 capsules by mouth 2 (two) times daily.     . Famotidine 20 MG CHEW Chew 1 tablet (20 mg total) by mouth daily. 60 each 1  . nitroGLYCERIN (NITROSTAT) 0.4 MG SL tablet Place 1 tablet (0.4 mg total) under the tongue every 5 (five) minutes as needed for chest pain. (Patient not taking:  Reported on 04/24/2015) 10 tablet 1   No current facility-administered medications for this visit.       Objective:    BP 131/86 mmHg  Pulse 73  Temp(Src) 98.3 F (36.8 C) (Oral)  Ht 5\' 1"  (1.549 m)  Wt 151 lb 3.2 oz (68.584 kg)  BMI 28.58 kg/m2  Wt Readings from Last 3 Encounters:  04/24/15 151 lb 3.2 oz (68.584 kg)  03/17/13 163 lb (73.936 kg)  11/08/12 164 lb (74.39 kg)     Gen: appears older than stated age, NAD, alert EYES: EOMI, no scleral injection or icterus ENT:  TMs pearly gray b/l, OP without erythema LYMPH: no cervical LAD CV: NRRR, normal S1/S2, no murmur, distal pulses 2+ b/l Resp: CTABL, no wheezes, normal WOB Abd: +BS, soft, NTND. no guarding or organomegaly Ext: No edema, warm Neuro: Alert and oriented, Decreased hand grip L hand and L Arm strength compared  with R.       Assessment & Plan:    Tonicia was seen today for multiple med problem f/u.  Diagnoses and all orders for this visit:  Gastroesophageal reflux disease, esophagitis presence not specified Switch to famotidine. Rarely has symptoms and interested in stopping PPI. -     Famotidine 20 MG CHEW; Chew 1 tablet (20 mg total) by mouth daily.  Essential hypertension, benign Well controlled today, continue current medications  Atherosclerosis of native coronary artery of native heart without angina pectoris S/p stent placement per pt. Continue aspirin, statin.  RENAL ARTERY STENOSIS s/p stent placement  History of CVA with residual deficit Continue ASA, statin, no further strokes.  BMI 28.0-28.9,adult discussed lifestyle changes, remaining active, nutrition choices.  Depression screen negative   Follow up plan: Return in about 2 months (around 06/25/2015).  Assunta Found, MD [facility] 04/24/2015, 9:46 AM

## 2015-04-24 NOTE — Patient Instructions (Signed)
famotidine

## 2015-05-21 DIAGNOSIS — Z0289 Encounter for other administrative examinations: Secondary | ICD-10-CM

## 2015-06-27 ENCOUNTER — Ambulatory Visit (INDEPENDENT_AMBULATORY_CARE_PROVIDER_SITE_OTHER): Payer: Medicare HMO | Admitting: Pediatrics

## 2015-06-27 ENCOUNTER — Encounter: Payer: Self-pay | Admitting: Pediatrics

## 2015-06-27 VITALS — BP 136/87 | HR 67 | Temp 97.9°F | Ht 61.0 in | Wt 151.0 lb

## 2015-06-27 DIAGNOSIS — E785 Hyperlipidemia, unspecified: Secondary | ICD-10-CM | POA: Diagnosis not present

## 2015-06-27 DIAGNOSIS — I6930 Unspecified sequelae of cerebral infarction: Secondary | ICD-10-CM | POA: Diagnosis not present

## 2015-06-27 DIAGNOSIS — I10 Essential (primary) hypertension: Secondary | ICD-10-CM | POA: Diagnosis not present

## 2015-06-27 DIAGNOSIS — N183 Chronic kidney disease, stage 3 unspecified: Secondary | ICD-10-CM

## 2015-06-27 DIAGNOSIS — R7303 Prediabetes: Secondary | ICD-10-CM

## 2015-06-27 LAB — POCT GLYCOSYLATED HEMOGLOBIN (HGB A1C): Hemoglobin A1C: 5.7

## 2015-06-27 NOTE — Progress Notes (Signed)
Subjective:    Patient ID: [name], female    DOB: 11/21/1955, 60 y.o.   MRN: [id number]  CC: Follow-up multiple med problems  HPI: [name] is a 60 y.o. female presenting for Follow-up  Moved recently to new house in area Living now with kids of ex-boyfriend Thinking about moving to [place] with a friend  Normal urination No CP, no SOB  HTN: takes medicine daily, no headaches or vision changes  No falls, uses walker for L sided deficits from prior CVA  Has had Hep C screen that was negative in th epast  CVA with residual L sided defects, leg more than arm  HLD: takes statin regularly  No CP, SOB, no dizziness or lightheadedness Has Odom feeling well, normal appetite   Depression screen [facility] 2/9 06/27/2015 04/24/2015  Decreased Interest 0 0  Down, Depressed, Hopeless 0 0  PHQ - 2 Score 0 0     Relevant past medical, surgical, family and social history reviewed and updated as indicated. Interim medical history since our last visit reviewed. Allergies and medications reviewed and updated.    ROS: Per HPI unless specifically indicated above  History  Smoking status  . Never Smoker   Smokeless tobacco  . Never Used    Past Medical History Patient Active Problem List   Diagnosis Date Noted  . BMI 28.0-28.9,adult 04/24/2015  . Osteoporosis, unspecified 11/08/2012  . Encounter for screening colonoscopy 03/28/2012  . Obesity, unspecified 11/05/2011  . Asthma 07/30/2011  . Prediabetes 06/03/2011  . ESSENTIAL HYPERTENSION, BENIGN 02/26/2009  . CORONARY ATHEROSCLEROSIS NATIVE CORONARY ARTERY 02/26/2009  . RENAL ARTERY STENOSIS 02/26/2009  . Hyperlipidemia 06/30/2006  . G E R D 06/30/2006  . KIDNEY DISEASE, CHRONIC, STAGE III 04/12/2006  . History of CVA with residual deficit 04/12/2006    Current Outpatient Prescriptions  Medication Sig Dispense Refill  . amLODipine (NORVASC) 10 MG tablet Take 1 tablet (10 mg total) by mouth daily. 90 tablet 1  .  aspirin (ASPIRIN EC) 81 MG EC tablet Take 81 mg by mouth daily.     Marland Kitchen atorvastatin (LIPITOR) 40 MG tablet Take 1 tablet (40 mg total) by mouth daily. 90 tablet 1  . Famotidine 20 MG CHEW Chew 1 tablet (20 mg total) by mouth daily. 60 each 1  . Omega-3 Fatty Acids (FISH OIL) 1000 MG CAPS Take 2 capsules by mouth 2 (two) times daily.     . nitroGLYCERIN (NITROSTAT) 0.4 MG SL tablet Place 1 tablet (0.4 mg total) under the tongue every 5 (five) minutes as needed for chest pain. (Patient not taking: Reported on 06/27/2015) 10 tablet 1   No current facility-administered medications for this visit.       Objective:    BP 136/87 mmHg  Pulse 67  Temp(Src) 97.9 F (36.6 C) (Oral)  Ht _0  (1.549 m)  Wt 151 lb (68.493 kg)  BMI 28.55 kg/m2  Wt Readings from Last 3 Encounters:  06/27/15 151 lb (68.493 kg)  04/24/15 151 lb 3.2 oz (68.584 kg)  03/17/13 163 lb (73.936 kg)     Gen: NAD, alert, cooperative with exam, NCAT EYES: EOMI, no scleral injection or icterus ENT:  OP without erythema CV: NRRR, normal S1/S2, no murmur, distal pulses 2+ b/l Resp: CTABL, no wheezes, normal WOB Abd: +BS, soft, NTND. Ext: No edema, warm Neuro: Alert and oriented MSK: normal muscle bulk     Assessment & Plan:    [name] was seen today for  follow-up mulitple med problems.  Diagnoses and all orders for this visit:  Essential hypertension, benign Adequate control today. Cont amlodipine. Check labs. 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 Followed a kidney infection per pt. Creatinine around one now. Continue to monitor.  Prediabetes Continue diet, lifestyle changes. -     POCT glycosylated hemoglobin (Hb A1C)  History of CVA with residual deficit On Atorvastatin 8m -     Lipid panel  Hyperlipidemia Continue current meds    Follow up plan: Return in about 6 months (around 12/25/2015) for 6 month f/u.  CAssunta Found MD [facility] 06/27/2015, 10:34 AM

## 2015-06-28 LAB — LIPID PANEL
CHOL/HDL RATIO: 2.7 ratio (ref 0.0–4.4)
Cholesterol, Total: 152 mg/dL (ref 100–199)
HDL: 56 mg/dL (ref >39)
LDL CALC: 75 mg/dL (ref 0–99)
TRIGLYCERIDES: 105 mg/dL (ref 0–149)
VLDL Cholesterol Cal: 21 mg/dL (ref 5–40)

## 2015-06-28 LAB — CMP14+EGFR
A/G RATIO: 1.6 (ref 1.1–2.5)
ALT: 19 IU/L (ref 0–32)
AST: 19 IU/L (ref 0–40)
Albumin: 4.4 g/dL (ref 3.6–4.8)
Alkaline Phosphatase: 136 IU/L — ABNORMAL HIGH (ref 39–117)
BILIRUBIN TOTAL: 0.2 mg/dL (ref 0.0–1.2)
BUN/Creatinine Ratio: 16 (ref 11–26)
BUN: 16 mg/dL (ref 8–27)
CHLORIDE: 102 mmol/L (ref 96–106)
CO2: 25 mmol/L (ref 18–29)
Calcium: 9.5 mg/dL (ref 8.7–10.3)
Creatinine, Ser: 0.99 mg/dL (ref 0.57–1.00)
GFR calc non Af Amer: 62 mL/min/{1.73_m2} (ref >59)
GFR, EST AFRICAN AMERICAN: 72 mL/min/{1.73_m2} (ref >59)
Globulin, Total: 2.8 g/dL (ref 1.5–4.5)
Glucose: 122 mg/dL — ABNORMAL HIGH (ref 65–99)
POTASSIUM: 4.4 mmol/L (ref 3.5–5.2)
SODIUM: 143 mmol/L (ref 134–144)
TOTAL PROTEIN: 7.2 g/dL (ref 6.0–8.5)

## 2015-06-28 NOTE — Progress Notes (Signed)
Patient aware.

## 2015-12-26 ENCOUNTER — Ambulatory Visit: Payer: Medicare HMO | Admitting: Pediatrics

## 2015-12-27 ENCOUNTER — Encounter: Payer: Self-pay | Admitting: Pediatrics

## 2022-04-06 ENCOUNTER — Encounter (INDEPENDENT_AMBULATORY_CARE_PROVIDER_SITE_OTHER): Payer: Self-pay | Admitting: *Deleted
